# Patient Record
Sex: Male | Born: 1960 | Race: White | Hispanic: No | Marital: Married | Smoking: Never smoker
Health system: Southern US, Community
[De-identification: ages and names within clinical notes are randomized; demographics above are authoritative.]

## PROBLEM LIST (undated history)

## (undated) DIAGNOSIS — I10 Essential (primary) hypertension: Secondary | ICD-10-CM

## (undated) DIAGNOSIS — F419 Anxiety disorder, unspecified: Secondary | ICD-10-CM

## (undated) DIAGNOSIS — I639 Cerebral infarction, unspecified: Secondary | ICD-10-CM

## (undated) DIAGNOSIS — F32A Depression, unspecified: Secondary | ICD-10-CM

## (undated) DIAGNOSIS — F329 Major depressive disorder, single episode, unspecified: Secondary | ICD-10-CM

## (undated) HISTORY — PX: FINGER FRACTURE SURGERY: SHX638

## (undated) HISTORY — DX: Anxiety disorder, unspecified: F41.9

---

## 2015-05-16 ENCOUNTER — Inpatient Hospital Stay (HOSPITAL_COMMUNITY)
Admission: EM | Admit: 2015-05-16 | Discharge: 2015-05-20 | DRG: 064 | Disposition: A | Discharge status: Home / Self Care | Payer: BLUE CROSS/BLUE SHIELD | Attending: Neurology | Admitting: Neurology

## 2015-05-16 ENCOUNTER — Emergency Department (HOSPITAL_COMMUNITY): Payer: BLUE CROSS/BLUE SHIELD

## 2015-05-16 ENCOUNTER — Encounter (HOSPITAL_COMMUNITY): Payer: Self-pay | Admitting: Cardiology

## 2015-05-16 DIAGNOSIS — I619 Nontraumatic intracerebral hemorrhage, unspecified: Secondary | ICD-10-CM

## 2015-05-16 DIAGNOSIS — E871 Hypo-osmolality and hyponatremia: Secondary | ICD-10-CM | POA: Diagnosis not present

## 2015-05-16 DIAGNOSIS — I611 Nontraumatic intracerebral hemorrhage in hemisphere, cortical: Principal | ICD-10-CM | POA: Diagnosis present

## 2015-05-16 DIAGNOSIS — I161 Hypertensive emergency: Secondary | ICD-10-CM | POA: Diagnosis present

## 2015-05-16 DIAGNOSIS — G936 Cerebral edema: Secondary | ICD-10-CM | POA: Diagnosis present

## 2015-05-16 DIAGNOSIS — F329 Major depressive disorder, single episode, unspecified: Secondary | ICD-10-CM | POA: Diagnosis not present

## 2015-05-16 DIAGNOSIS — I69120 Aphasia following nontraumatic intracerebral hemorrhage: Secondary | ICD-10-CM

## 2015-05-16 DIAGNOSIS — E785 Hyperlipidemia, unspecified: Secondary | ICD-10-CM | POA: Diagnosis not present

## 2015-05-16 DIAGNOSIS — R4701 Aphasia: Secondary | ICD-10-CM | POA: Diagnosis not present

## 2015-05-16 DIAGNOSIS — I10 Essential (primary) hypertension: Secondary | ICD-10-CM

## 2015-05-16 DIAGNOSIS — I629 Nontraumatic intracranial hemorrhage, unspecified: Secondary | ICD-10-CM | POA: Diagnosis present

## 2015-05-16 DIAGNOSIS — F32A Depression, unspecified: Secondary | ICD-10-CM | POA: Diagnosis present

## 2015-05-16 DIAGNOSIS — I6789 Other cerebrovascular disease: Secondary | ICD-10-CM | POA: Diagnosis not present

## 2015-05-16 HISTORY — DX: Essential (primary) hypertension: I10

## 2015-05-16 HISTORY — DX: Depression, unspecified: F32.A

## 2015-05-16 HISTORY — DX: Major depressive disorder, single episode, unspecified: F32.9

## 2015-05-16 LAB — COMPREHENSIVE METABOLIC PANEL
ALT: 26 U/L (ref 17–63)
AST: 19 U/L (ref 15–41)
Albumin: 4.4 g/dL (ref 3.5–5.0)
Alkaline Phosphatase: 78 U/L (ref 38–126)
Anion gap: 11 (ref 5–15)
BUN: 23 mg/dL — ABNORMAL HIGH (ref 6–20)
CO2: 22 mmol/L (ref 22–32)
Calcium: 9 mg/dL (ref 8.9–10.3)
Chloride: 99 mmol/L — ABNORMAL LOW (ref 101–111)
Creatinine, Ser: 1.1 mg/dL (ref 0.61–1.24)
GFR calc Af Amer: >60 mL/min (ref >60)
GFR calc non Af Amer: >60 mL/min (ref >60)
Glucose, Bld: 135 mg/dL — ABNORMAL HIGH (ref 65–99)
Potassium: 3.8 mmol/L (ref 3.5–5.1)
Sodium: 132 mmol/L — ABNORMAL LOW (ref 135–145)
Total Bilirubin: 1.3 mg/dL — ABNORMAL HIGH (ref 0.3–1.2)
Total Protein: 8.4 g/dL — ABNORMAL HIGH (ref 6.5–8.1)

## 2015-05-16 LAB — URINALYSIS, ROUTINE W REFLEX MICROSCOPIC
Bilirubin Urine: NEGATIVE
Glucose, UA: NEGATIVE mg/dL
Ketones, ur: 40 mg/dL — AB
Leukocytes, UA: NEGATIVE
Nitrite: NEGATIVE
Protein, ur: NEGATIVE mg/dL
Specific Gravity, Urine: 1.025 (ref 1.005–1.030)
pH: 5.5 (ref 5.0–8.0)

## 2015-05-16 LAB — CBC
HCT: 46.7 % (ref 39.0–52.0)
Hemoglobin: 16.3 g/dL (ref 13.0–17.0)
MCH: 32.9 pg (ref 26.0–34.0)
MCHC: 34.9 g/dL (ref 30.0–36.0)
MCV: 94.2 fL (ref 78.0–100.0)
Platelets: 285 10*3/uL (ref 150–400)
RBC: 4.96 MIL/uL (ref 4.22–5.81)
RDW: 12.2 % (ref 11.5–15.5)
WBC: 7.5 10*3/uL (ref 4.0–10.5)

## 2015-05-16 LAB — RAPID URINE DRUG SCREEN, HOSP PERFORMED
Amphetamines: NOT DETECTED
Barbiturates: NOT DETECTED
Benzodiazepines: NOT DETECTED
Cocaine: NOT DETECTED
Opiates: NOT DETECTED
Tetrahydrocannabinol: NOT DETECTED

## 2015-05-16 LAB — DIFFERENTIAL
Basophils Absolute: 0 10*3/uL (ref 0.0–0.1)
Basophils Relative: 0 %
Eosinophils Absolute: 0 10*3/uL (ref 0.0–0.7)
Eosinophils Relative: 0 %
Lymphocytes Relative: 21 %
Lymphs Abs: 1.6 10*3/uL (ref 0.7–4.0)
Monocytes Absolute: 0.7 10*3/uL (ref 0.1–1.0)
Monocytes Relative: 9 %
Neutro Abs: 5.3 10*3/uL (ref 1.7–7.7)
Neutrophils Relative %: 70 %

## 2015-05-16 LAB — APTT: aPTT: 27 seconds (ref 24–37)

## 2015-05-16 LAB — URINE MICROSCOPIC-ADD ON

## 2015-05-16 LAB — I-STAT TROPONIN, ED: Troponin i, poc: 0 ng/mL (ref 0.00–0.08)

## 2015-05-16 LAB — PROTIME-INR
INR: 1.08 (ref 0.00–1.49)
Prothrombin Time: 14.2 seconds (ref 11.6–15.2)

## 2015-05-16 LAB — GLUCOSE, CAPILLARY: GLUCOSE-CAPILLARY: 135 mg/dL — AB (ref 65–99)

## 2015-05-16 LAB — ETHANOL: Alcohol, Ethyl (B): <5 mg/dL (ref <5)

## 2015-05-16 MED ORDER — SENNOSIDES-DOCUSATE SODIUM 8.6-50 MG PO TABS
1.0000 | ORAL_TABLET | Freq: Two times a day (BID) | ORAL | Status: DC
  Administered 2015-05-18 – 2015-05-20 (×4): 1 via ORAL
  Filled 2015-05-16 (×4): qty 1

## 2015-05-16 MED ORDER — LABETALOL HCL 5 MG/ML IV SOLN
20.0000 mg | INTRAVENOUS | Status: DC | PRN
  Administered 2015-05-16: 20 mg via INTRAVENOUS
  Filled 2015-05-16: qty 4

## 2015-05-16 MED ORDER — ACETAMINOPHEN 325 MG PO TABS
650.0000 mg | ORAL_TABLET | ORAL | Status: DC | PRN
  Administered 2015-05-19 – 2015-05-20 (×4): 650 mg via ORAL
  Filled 2015-05-16 (×4): qty 2

## 2015-05-16 MED ORDER — ACETAMINOPHEN 650 MG RE SUPP: 650.0000 mg | RECTAL | Status: DC | PRN

## 2015-05-16 MED ORDER — PANTOPRAZOLE SODIUM 40 MG IV SOLR
40.0000 mg | Freq: Every day | INTRAVENOUS | Status: DC
  Administered 2015-05-17 – 2015-05-18 (×3): 40 mg via INTRAVENOUS
  Filled 2015-05-16 (×3): qty 40

## 2015-05-16 MED ORDER — NICARDIPINE HCL IN NACL 20-0.86 MG/200ML-% IV SOLN
3.0000 mg/h | INTRAVENOUS | Status: DC
Start: 2015-05-16 — End: 2015-05-18
  Administered 2015-05-16: 2.5 mg/h via INTRAVENOUS
  Administered 2015-05-16: 5 mg/h via INTRAVENOUS
  Filled 2015-05-16 (×2): qty 200

## 2015-05-16 MED ORDER — STROKE: EARLY STAGES OF RECOVERY BOOK
Freq: Once | Status: AC
  Administered 2015-05-17: 1
  Filled 2015-05-16: qty 1

## 2015-05-16 NOTE — ED Notes (Signed)
Headache and confusion times 3 days.

## 2015-05-16 NOTE — ED Notes (Signed)
Report given to [facility]. 

## 2015-05-16 NOTE — ED Provider Notes (Signed)
CSN: [id number]     Arrival date & time 05/16/15  1708 History   First MD Initiated Contact with Patient 05/16/15 1744     Chief Complaint  Patient presents with  . Altered Mental Status     (Consider location/radiation/quality/duration/timing/severity/associated sxs/prior Treatment) HPI   55 year old male with headache and confusion. History is from family at bedside. Son and wife. Patient began complaining of headache and family noticed some confusion 3 days ago. No trauma that they are aware of. Persistent confusion since then so they brought him in for evaluation. Wife reports that it also been complaining of some shoulder and neck pain. 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 Hx of depression and HTN. No blood thinners. They have no specific concern for ingestion/overdose.   Past Medical History  Diagnosis Date  . Hypertension   . Depression    History reviewed. No pertinent past surgical history. History reviewed. No pertinent family history. Social History  Substance Use Topics  . Smoking status: Never Smoker   . Smokeless tobacco: None  . Alcohol Use: Yes     Comment: occasional    Review of Systems  Level 5 caveat because of encephalopathy.   Allergies  Review of patient's allergies indicates no known allergies.  Home Medications   Prior to Admission medications   Not on File   BP 164/100 mmHg  Pulse 102  Temp(Src) 100.3 F (37.9 C) (Rectal)  Resp 24  Ht  (1.854 m)  Wt 215 lb (97.523 kg)  BMI 28.37 kg/m2  SpO2 96% Physical Exam  Constitutional: He appears well-developed and well-nourished. No distress.  HENT:  Head: Normocephalic and atraumatic.  Eyes: Conjunctivae are normal. Right eye exhibits no discharge. Left eye exhibits no discharge.  Neck: Neck supple.  Cardiovascular: Normal rate, regular rhythm and normal heart sounds.  Exam reveals no  gallop and no friction rub.   No murmur heard. Pulmonary/Chest: Effort normal and breath sounds normal. No respiratory distress.  Abdominal: Soft. He exhibits no distension. There is no tenderness.  Musculoskeletal: He exhibits no edema or tenderness.  Neurological: He is alert.  Speech is clear but answers most questions with "yeah" and other answers nonsensical. Follows simple commands but has difficulty with more complex such as finger to nose or trying to assess visual fields. CN 2-12 seem intact. Strength 5/5 b/l u/l extremities. Did not attempt to ambulate. Could not assess sensation.   Skin: Skin is warm and dry.  Psychiatric: He has a normal mood and affect. His behavior is normal. Thought content normal.  Nursing note and vitals reviewed.   ED Course  Procedures (including critical care time)  CRITICAL CARE Performed by: Raeford Razor Total critical care time: 35 minutes Critical care time was exclusive of separately billable procedures and treating other patients. Critical care was necessary to treat or prevent imminent or life-threatening deterioration. Critical care was time spent personally by me on the following activities: development of treatment plan with patient and/or surrogate as well as nursing, discussions with consultants, evaluation of patient's response to treatment, examination of patient, obtaining history from patient or surrogate, ordering and performing treatments and interventions, ordering and review of laboratory studies, ordering and review of radiographic studies, pulse oximetry and re-evaluation of patient's condition.  Labs Review Labs Reviewed  MRSA PCR SCREENING - Abnormal; Notable for the following:    MRSA by  PCR POSITIVE (*)    All other components within normal limits  COMPREHENSIVE METABOLIC PANEL - Abnormal; Notable for the following:    Sodium 132 (*)    Chloride 99 (*)    Glucose, Bld 135 (*)    BUN 23 (*)    Total Protein 8.4 (*)     Total Bilirubin 1.3 (*)    All other components within normal limits  URINALYSIS, ROUTINE W REFLEX MICROSCOPIC (NOT AT [facility]) - Abnormal; Notable for the following:    Hgb urine dipstick MODERATE (*)    Ketones, ur 40 (*)    All other components within normal limits  URINE MICROSCOPIC-ADD ON - Abnormal; Notable for the following:    Squamous Epithelial / LPF 0-5 (*)    Bacteria, UA RARE (*)    All other components within normal limits  GLUCOSE, CAPILLARY - Abnormal; Notable for the following:    Glucose-Capillary 135 (*)    All other components within normal limits  LIPID PANEL - Abnormal; Notable for the following:    Cholesterol 208 (*)    HDL 35 (*)    LDL Cholesterol 154 (*)    All other components within normal limits  TSH - Abnormal; Notable for the following:    TSH 4.802 (*)    All other components within normal limits  BASIC METABOLIC PANEL - Abnormal; Notable for the following:    CO2 20 (*)    Glucose, Bld 101 (*)    Calcium 8.5 (*)    All other components within normal limits  BASIC METABOLIC PANEL - Abnormal; Notable for the following:    Glucose, Bld 119 (*)    Calcium 8.8 (*)    All other components within normal limits  BASIC METABOLIC PANEL - Abnormal; Notable for the following:    Potassium 3.3 (*)    Glucose, Bld 119 (*)    Calcium 8.6 (*)    All other components within normal limits  ETHANOL  PROTIME-INR  APTT  CBC  DIFFERENTIAL  URINE RAPID DRUG SCREEN, HOSP PERFORMED  CBC  HEMOGLOBIN A1C  VITAMIN B12  RPR  HIV ANTIBODY (ROUTINE TESTING)  CBC  CBC  I-STAT CHEM 8, ED  I-STAT TROPOININ, ED    Imaging Review No results found. I have personally reviewed and evaluated these images and lab results as part of my medical decision-making.   EKG Interpretation None      MDM   Final diagnoses:  Intracranial hemorrhage (HCC)  Essential hypertension    54yM who has been confused and complaining of headache for the past 3 days. On exam he is  confused. Answers most questions with "yeah." Other answers are nonsensical. Unclear if simply confused versus Broca's aphasia. Consider CVA. Also shoulder and neck pain. Rectal temp 100.3. Consider meningitis/encepahilitis. Will obtain CT head first. Likely LP.   6:34 PM Unfortunately, CT with head bleed. Suspect hypertensive. No known malignancy. No blood thinners. Will start with PRN labetolol. Possible infusion for further BP control if needed. Likely transfer to [facility].     Raeford Razor, MD 05/21/15 [phone]

## 2015-05-16 NOTE — H&P (Signed)
Neurology H&P  CC: Confusion  History is obtained from: Family, patient  HPI: Dakota [name] is a 55 y.o. male with a history of hypertension who was in his normal state of health until 3 days ago at which point he began having confusion and headache. He is not doing better today and therefore sought care in the emergency room where he was found to have ICH by head CT. He was hypertensive and did not respond to labetalol and therefore the carpet was started and he has been transferred from [facility] to [facility] for further management.   LKW: 2/13 tpa given?: no, ICH ICH Score: 0   ROS:  Unable to obtain due to aphasia  Past Medical History  Diagnosis Date  . Hypertension   . Depression      Family history: Unable to obtain due to aphasia   Social History:  reports that he has never smoked. He does not have any smokeless tobacco history on file. He reports that he drinks alcohol. He reports that he does not use illicit drugs.   Exam: Current vital signs: BP 132/79 mmHg  Pulse 80  Temp(Src) 99.2 F (37.3 C) (Oral)  Resp 11  Ht  (1.854 m)  Wt 97.9 kg (215 lb 13.3 oz)  BMI 28.48 kg/m2  SpO2 96% Vital signs in last 24 hours: Temp:  [99.2 F (37.3 C)-100.3 F (37.9 C)] 99.2 F (37.3 C) (02/16 2309) Pulse Rate:  [66-102] 80 (02/16 2330) Resp:  [8-24] 11 (02/16 2330) BP: (111-176)/(63-110) 132/79 mmHg (02/16 2330) SpO2:  [94 %-99 %] 96 % (02/16 2330) Weight:  [97.523 kg (215 lb)-97.9 kg (215 lb 13.3 oz)] 97.9 kg (215 lb 13.3 oz) (02/16 2300)   Physical Exam  Constitutional: Appears well-developed and well-nourished.  Psych: Affect appropriate to situation Eyes: No scleral injection HENT: No OP obstrucion Head: Normocephalic.  Cardiovascular: Normal rate and regular rhythm.  Respiratory: Effort normal and breath sounds normal to anterior ascultation GI: Soft.  No distension. There is no tenderness.  Skin: WDI  Neuro: Mental Status: Patient is awake,  alert, he has a predominantly fluent aphasia. He is able to answer some questions, and with repetition is able to answer reasonably fluently. He has difficulty understanding commands without being shown what to do. Cranial Nerves: II: Visual Fields are full. Pupils are equal, round, and reactive to light.   III,IV, VI: EOMI without ptosis or diploplia.  V: Facial sensation is symmetric to temperature VII: Facial movement is symmetric.  VIII: hearing is intact to voice X: Uvula elevates symmetrically XI: Shoulder shrug is symmetric. XII: tongue is midline without atrophy or fasciculations.  Motor: Tone is normal. Bulk is normal. 5/5 strength was present in all four extremities.  Sensory: Sensation is symmetric to light touch and temperature in the arms and legs. Cerebellar: FNF intact bilaterally    I have reviewed labs in epic and the results pertinent to this consultation are: CMP-mild hyponatremia  I have reviewed the images obtained: CT head-left parietotemporal ICH  Impression: 55 year old male with what I suspect is a hypertensive hemorrhage which likely happened 3 days ago. His been admitted to the intensive care unit for aggressive blood pressure management with nicardipine.  Recommendations: 1) Admit to ICU 2) no antiplatelets or anticoagulants 3) blood pressure control with goal systolic <140 4) Frequent neuro checks 5) If symptoms worsen or there is decreased mental status, repeat stat head CT 6) PT,OT,ST 7) Micardis pain for blood pressure control  This patient is critically ill and at significant risk of neurological worsening, death and care requires constant monitoring of vital signs, hemodynamics,respiratory and cardiac monitoring, neurological assessment, discussion with family, other specialists and medical decision making of high complexity. I spent 35 minutes of neurocritical care time  in the care of  this patient.  Ritta Slot, MD [facility] [phone]  If 7pm- 7am, please page neurology on call as listed in AMION. 05/16/2015  11:39 PM

## 2015-05-17 ENCOUNTER — Encounter (HOSPITAL_COMMUNITY): Payer: Self-pay | Admitting: Radiology

## 2015-05-17 ENCOUNTER — Inpatient Hospital Stay (HOSPITAL_COMMUNITY): Payer: BLUE CROSS/BLUE SHIELD

## 2015-05-17 DIAGNOSIS — I6789 Other cerebrovascular disease: Secondary | ICD-10-CM

## 2015-05-17 DIAGNOSIS — R4701 Aphasia: Secondary | ICD-10-CM

## 2015-05-17 DIAGNOSIS — I611 Nontraumatic intracerebral hemorrhage in hemisphere, cortical: Principal | ICD-10-CM

## 2015-05-17 LAB — CBC
HEMATOCRIT: 43.5 % (ref 39.0–52.0)
Hemoglobin: 15.4 g/dL (ref 13.0–17.0)
MCH: 33.2 pg (ref 26.0–34.0)
MCHC: 35.4 g/dL (ref 30.0–36.0)
MCV: 93.8 fL (ref 78.0–100.0)
PLATELETS: ADEQUATE 10*3/uL (ref 150–400)
RBC: 4.64 MIL/uL (ref 4.22–5.81)
RDW: 12.6 % (ref 11.5–15.5)
WBC: 6.9 10*3/uL (ref 4.0–10.5)

## 2015-05-17 LAB — VITAMIN B12: VITAMIN B 12: 312 pg/mL (ref 180–914)

## 2015-05-17 LAB — TSH: TSH: 4.802 u[IU]/mL — AB (ref 0.350–4.500)

## 2015-05-17 LAB — HIV ANTIBODY (ROUTINE TESTING W REFLEX): HIV Screen 4th Generation wRfx: NONREACTIVE

## 2015-05-17 LAB — LIPID PANEL
CHOL/HDL RATIO: 5.9 ratio
Cholesterol: 208 mg/dL — ABNORMAL HIGH (ref 0–200)
HDL: 35 mg/dL — AB (ref >40)
LDL CALC: 154 mg/dL — AB (ref 0–99)
Triglycerides: 95 mg/dL (ref <150)
VLDL: 19 mg/dL (ref 0–40)

## 2015-05-17 LAB — MRSA PCR SCREENING: MRSA by PCR: POSITIVE — AB

## 2015-05-17 LAB — RPR: RPR Ser Ql: NONREACTIVE

## 2015-05-17 MED ORDER — MUPIROCIN 2 % EX OINT
1.0000 "application " | TOPICAL_OINTMENT | Freq: Two times a day (BID) | CUTANEOUS | Status: DC
  Administered 2015-05-17 – 2015-05-20 (×7): 1 via NASAL
  Filled 2015-05-17 (×2): qty 22

## 2015-05-17 MED ORDER — SODIUM CHLORIDE 0.9 % IV SOLN
INTRAVENOUS | Status: DC
  Administered 2015-05-17 (×2): via INTRAVENOUS

## 2015-05-17 MED ORDER — IOHEXOL 350 MG/ML SOLN
50.0000 mL | Freq: Once | INTRAVENOUS | Status: AC | PRN
  Administered 2015-05-17: 50 mL via INTRAVENOUS

## 2015-05-17 MED ORDER — CHLORHEXIDINE GLUCONATE CLOTH 2 % EX PADS
6.0000 | MEDICATED_PAD | Freq: Every day | CUTANEOUS | Status: DC
  Administered 2015-05-17 – 2015-05-20 (×4): 6 via TOPICAL

## 2015-05-17 NOTE — Progress Notes (Addendum)
STROKE TEAM PROGRESS NOTE   HISTORY OF PRESENT ILLNESS Dakota [name] is a 55 y.o. male with a history of hypertension who was in his normal state of health until 3 days ago at which point he began having confusion and headache (LKW 05/13/15) . He is not doing better today 2/16 and therefore sought care in the emergency room where he was found to have ICH by head CT. He was hypertensive and did not respond to labetalol and therefore the cardene was started and he has been transferred from [facility] to [facility] for further management. ICH Score: 0. He was admitted to the neuro ICU for further evaluation and treatment.   SUBJECTIVE (INTERVAL HISTORY) His fiancee is at the bedside.  Overall he feels his condition is stable. Off cardene and BP stable. Pt still has partial global aphasia. Steffanie Rainwater stated that he has BP issue and not able to control at home with medications.    OBJECTIVE Temp:  [98.3 F (36.8 C)-100.3 F (37.9 C)] 98.3 F (36.8 C) (02/17 1600) Pulse Rate:  [57-102] 59 (02/17 1500) Cardiac Rhythm:  [-] Normal sinus rhythm (02/17 1400) Resp:  [8-24] 17 (02/17 1500) BP: (107-176)/(62-110) 130/80 mmHg (02/17 1500) SpO2:  [88 %-99 %] 95 % (02/17 1500) Weight:  [215 lb (97.523 kg)-215 lb 13.3 oz (97.9 kg)] 215 lb 13.3 oz (97.9 kg) (02/16 2300)  CBC:   Recent Labs Lab 05/16/15 1832 05/17/15 0658  WBC 7.5 6.9  NEUTROABS 5.3  --   HGB 16.3 15.4  HCT 46.7 43.5  MCV 94.2 93.8  PLT 285 PLATELET CLUMPS NOTED ON SMEAR, COUNT APPEARS ADEQUATE    Basic Metabolic Panel:   Recent Labs Lab 05/16/15 1832  NA 132*  K 3.8  CL 99*  CO2 22  GLUCOSE 135*  BUN 23*  CREATININE 1.10  CALCIUM 9.0    Lipid Panel:     Component Value Date/Time   CHOL 208* 05/17/2015 0658   TRIG 95 05/17/2015 0658   HDL 35* 05/17/2015 0658   CHOLHDL 5.9 05/17/2015 0658   VLDL 19 05/17/2015 0658   LDLCALC 154* 05/17/2015 0658   HgbA1c: No results found for: HGBA1C Urine Drug Screen:      Component Value Date/Time   LABOPIA NONE DETECTED 05/16/2015 2004   COCAINSCRNUR NONE DETECTED 05/16/2015 2004   LABBENZ NONE DETECTED 05/16/2015 2004   AMPHETMU NONE DETECTED 05/16/2015 2004   THCU NONE DETECTED 05/16/2015 2004   LABBARB NONE DETECTED 05/16/2015 2004      IMAGING I have personally reviewed the radiological images below and agree with the radiology interpretations. Blue text is my interpretation.  Ct Angio Head W/cm &/or Wo Cm  05/17/2015  IMPRESSION: Extrinsic mass effect on the LEFT anterior circulation from a large intraparenchymal hematoma, likely hypertensive in etiology. No associated vascular malformation or visible saccular or mycotic aneurysm. No evidence for MCA dissection. No abnormal postcontrast enhancement to suggest neoplasm. Increasing size compared with 05/16/2015 noncontrast CT. Based on cross-sectional measurements of 26 x 40 x 27 mm, approximate volume 14 mL. 5 mm LEFT-to-RIGHT shift with mild medialization of the LEFT uncus. Continued surveillance is warranted. There is no significant change of hematoma size over time, there was and is left temporoparietal hematoma about 30cc.   Ct Head Wo Contrast  05/16/2015  IMPRESSION: Large intraparenchymal hemorrhage seen in left parietal and temporal cortex resulting in 5 mm of left to right midline shift.   2D echo - pending   PHYSICAL EXAM  Temp:  [  98.3 F (36.8 C)-100.3 F (37.9 C)] 98.3 F (36.8 C) (02/17 1600) Pulse Rate:  [57-102] 59 (02/17 1500) Resp:  [8-24] 17 (02/17 1500) BP: (107-176)/(62-110) 130/80 mmHg (02/17 1500) SpO2:  [88 %-99 %] 95 % (02/17 1500) Weight:  [215 lb (97.523 kg)-215 lb 13.3 oz (97.9 kg)] 215 lb 13.3 oz (97.9 kg) (02/16 2300)  General - Well nourished, well developed, in no apparent distress.  Ophthalmologic - Fundi not visualized due to not following commands.  Cardiovascular - Regular rate and rhythm.  Mental Status -  Level of arousal and orientation to self  were intact, not able to answer other orientation questions due to aphasia. Partial global aphasia, able to pantomime, but not able to name or repeat.   Cranial Nerves II - XII - II - blinking to visual threat bilaterally. III, IV, VI - Extraocular movements intact. V - Facial sensation intact bilaterally. VII - Facial movement intact bilaterally. VIII - Hearing & vestibular intact bilaterally. X - Palate elevates symmetrically. XI - Chin turning & shoulder shrug intact bilaterally. XII - Tongue protrusion intact.  Motor Strength - The patient's strength was normal in all extremities and pronator drift was absent.  Bulk was normal and fasciculations were absent.   Motor Tone - Muscle tone was assessed at the neck and appendages and was normal.  Reflexes - The patient's reflexes were 1+ in all extremities and he had no pathological reflexes.  Sensory - Light touch, temperature/pinprick were assessed and were symmetrical.    Coordination - not cooperative on exam.  Tremor was absent.  Gait and Station - not tested.   ASSESSMENT/PLAN [name] is a 55 y.o. male with history of hypertension and depression presenting with confusion and HA. CT shows a large L parietal temporal hemorrhage.   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  CT angio head and neck - no AVM, aneurysm or malignancy seen  2D Echo  pending  LDL 154  HgbA1c pending  SCDs for VTE prophylaxis Diet NPO time specified - pending swallow evaluation today  No antithrombotic prior to admission, none with admission due to ICH  Ongoing aggressive stroke risk factor management  Therapy recommendations:  pending  Disposition:  Pending  Transfer to floor in am  Hypertensive  Emergency   BP 176/110 on arrival in setting of neurologic symptoms  Placed on cardene drip, now off  SBP goal < 140  Stable, even no BP meds needed at this time.  Hyperlipidemia  LDL 154, goal < 70  No home meds  Will initiate statin once passed swallow.  Continue on discharge  Other Stroke Risk Factors  ETOH use  Other Active Problems  Depression  Hospital day # 1  This patient is critically ill due to left temporoparietal ICH and at significant risk of neurological worsening, death form hematoma expansion, uncal herniation, cerebral edema and seizure. This patient's care requires constant monitoring of vital signs, hemodynamics, respiratory and cardiac monitoring, review of multiple databases, neurological assessment, discussion with family, other specialists and medical decision making of high complexity. I spent 40 minutes of neurocritical care time in the care of this patient.  Marvel Plan, MD PhD Stroke Neurology 05/17/2015 4:46 PM   To contact Stroke Continuity provider, please refer to [email or web address]. After  hours, contact General Neurology

## 2015-05-17 NOTE — Progress Notes (Signed)
Utilization review completed. Keaun Schnabel, RN, BSN. 

## 2015-05-17 NOTE — Progress Notes (Signed)
PT Cancellation Note  Patient Details Name: Dakota [name] MRN: [id number] DOB: December 04, 1960   Cancelled Treatment:    Reason Eval/Treat Not Completed: Patient not medically ready.  Pt currently on bedrest.  Please advance activity order once appropriate for PT and mobility.     Sunny Schlein, [place] [phone] 05/17/2015, 8:21 AM

## 2015-05-17 NOTE — Progress Notes (Signed)
  Echocardiogram 2D Echocardiogram has been performed.  Arvil Chaco 05/17/2015, 12:46 PM

## 2015-05-17 NOTE — Plan of Care (Signed)
Problem: Education: Goal: Knowledge of patient specific risk factors addressed and post discharge goals established will improve Outcome: Progressing Discussed risk factor of HTN on ICH with patient and family.

## 2015-05-17 NOTE — Care Management Note (Signed)
Case Management Note  Patient Details  Name: Dakota [name] MRN: [id number] Date of Birth: [birth year]  Subjective/Objective:     Pt admitted on 05/16/15 with Lt parietal hemorrhage.  PTA, pt independent, has supportive son and fiance.                Action/Plan: Will follow for discharge planning as pt progresses.    Expected Discharge Date:                  Expected Discharge Plan:  IP Rehab Facility  In-House Referral:     Discharge planning Services  CM Consult  Post Acute Care Choice:    Choice offered to:     DME Arranged:    DME Agency:     HH Arranged:    HH Agency:     Status of Service:  In process, will continue to follow  Medicare Important Message Given:    Date Medicare IM Given:    Medicare IM give by:    Date Additional Medicare IM Given:    Additional Medicare Important Message give by:     If discussed at Long Length of Stay Meetings, dates discussed:    Additional Comments:  Quintella Baton, RN, BSN  Trauma/Neuro ICU Case Manager [phone]

## 2015-05-18 LAB — CBC
HEMATOCRIT: 41.3 % (ref 39.0–52.0)
HEMOGLOBIN: 14.1 g/dL (ref 13.0–17.0)
MCH: 32.6 pg (ref 26.0–34.0)
MCHC: 34.1 g/dL (ref 30.0–36.0)
MCV: 95.4 fL (ref 78.0–100.0)
Platelets: 234 10*3/uL (ref 150–400)
RBC: 4.33 MIL/uL (ref 4.22–5.81)
RDW: 12.4 % (ref 11.5–15.5)
WBC: 6 10*3/uL (ref 4.0–10.5)

## 2015-05-18 LAB — BASIC METABOLIC PANEL
ANION GAP: 9 (ref 5–15)
BUN: 20 mg/dL (ref 6–20)
CHLORIDE: 109 mmol/L (ref 101–111)
CO2: 20 mmol/L — AB (ref 22–32)
Calcium: 8.5 mg/dL — ABNORMAL LOW (ref 8.9–10.3)
Creatinine, Ser: 0.99 mg/dL (ref 0.61–1.24)
GFR calc Af Amer: >60 mL/min (ref >60)
GFR calc non Af Amer: >60 mL/min (ref >60)
GLUCOSE: 101 mg/dL — AB (ref 65–99)
POTASSIUM: 4 mmol/L (ref 3.5–5.1)
Sodium: 138 mmol/L (ref 135–145)

## 2015-05-18 LAB — HEMOGLOBIN A1C
Hgb A1c MFr Bld: 5.6 % (ref 4.8–5.6)
Mean Plasma Glucose: 114 mg/dL

## 2015-05-18 NOTE — Progress Notes (Signed)
STROKE TEAM PROGRESS NOTE   HISTORY OF PRESENT ILLNESS Dakota [name] is a 55 y.o. male with a history of hypertension who was in his normal state of health until 3 days ago at which point he began having confusion and headache (LKW 05/13/15) . He is not doing better today 2/16 and therefore sought care in the emergency room where he was found to have ICH by head CT. He was hypertensive and did not respond to labetalol and therefore the cardene was started and he has been transferred from [facility] to [facility] for further management. ICH Score: 0. He was admitted to the neuro ICU for further evaluation and treatment.   SUBJECTIVE (INTERVAL HISTORY) Son's girlfriend is at bedside  Overall he feels his condition is stable. Off cardene and BP stable. Pt still has partial global aphasia. Steffanie Rainwater stated yesterday that he has BP issue and not able to control at home with medications.    OBJECTIVE Temp:  [97.2 F (36.2 C)-98.9 F (37.2 C)] 97.9 F (36.6 C) (02/18 0728) Pulse Rate:  [58-83] 63 (02/18 0700) Cardiac Rhythm:  [-] Normal sinus rhythm (02/18 0400) Resp:  [9-22] 13 (02/18 0700) BP: (114-162)/(64-96) 134/73 mmHg (02/18 0700) SpO2:  [91 %-99 %] 96 % (02/18 0700)  CBC:   Recent Labs Lab 05/16/15 1832 05/17/15 0658 05/18/15 0411  WBC 7.5 6.9 6.0  NEUTROABS 5.3  --   --   HGB 16.3 15.4 14.1  HCT 46.7 43.5 41.3  MCV 94.2 93.8 95.4  PLT 285 PLATELET CLUMPS NOTED ON SMEAR, COUNT APPEARS ADEQUATE 234    Basic Metabolic Panel:   Recent Labs Lab 05/16/15 1832 05/18/15 0411  NA 132* 138  K 3.8 4.0  CL 99* 109  CO2 22 20*  GLUCOSE 135* 101*  BUN 23* 20  CREATININE 1.10 0.99  CALCIUM 9.0 8.5*    Lipid Panel:     Component Value Date/Time   CHOL 208* 05/17/2015 0658   TRIG 95 05/17/2015 0658   HDL 35* 05/17/2015 0658   CHOLHDL 5.9 05/17/2015 0658   VLDL 19 05/17/2015 0658   LDLCALC 154* 05/17/2015 0658   HgbA1c:  Lab Results  Component Value Date   HGBA1C 5.6  05/17/2015   Urine Drug Screen:     Component Value Date/Time   LABOPIA NONE DETECTED 05/16/2015 2004   COCAINSCRNUR NONE DETECTED 05/16/2015 2004   LABBENZ NONE DETECTED 05/16/2015 2004   AMPHETMU NONE DETECTED 05/16/2015 2004   THCU NONE DETECTED 05/16/2015 2004   LABBARB NONE DETECTED 05/16/2015 2004      IMAGING I have personally reviewed the radiological images below and agree with the radiology interpretations. Blue text is my interpretation.  Ct Angio Head W/cm &/or Wo Cm  05/17/2015  IMPRESSION: Extrinsic mass effect on the LEFT anterior circulation from a large intraparenchymal hematoma, likely hypertensive in etiology. No associated vascular malformation or visible saccular or mycotic aneurysm. No evidence for MCA dissection. No abnormal postcontrast enhancement to suggest neoplasm. Increasing size compared with 05/16/2015 noncontrast CT. Based on cross-sectional measurements of 26 x 40 x 27 mm, approximate volume 14 mL. 5 mm LEFT-to-RIGHT shift with mild medialization of the LEFT uncus. Continued surveillance is warranted. There is no significant change of hematoma size over time, there was and is left temporoparietal hematoma about 30cc.   Ct Head Wo Contrast  05/16/2015  IMPRESSION: Large intraparenchymal hemorrhage seen in left parietal and temporal cortex resulting in 5 mm of left to right midline shift.  2D echo - pending   PHYSICAL EXAM  Temp:  [97.2 F (36.2 C)-98.9 F (37.2 C)] 97.9 F (36.6 C) (02/18 0728) Pulse Rate:  [58-83] 63 (02/18 0700) Resp:  [9-22] 13 (02/18 0700) BP: (114-162)/(64-96) 134/73 mmHg (02/18 0700) SpO2:  [91 %-99 %] 96 % (02/18 0700)  neurologic exam stable as compared to yesterday  General - Well nourished, well developed, in no apparent distress.  Ophthalmologic - Fundi not visualized due to not following commands.  Cardiovascular - Regular rate and rhythm.  Mental Status -  Level of arousal and orientation to self were  intact, not able to answer other orientation questions due to aphasia. Partial global aphasia, able to pantomime, but not able to name or repeat.   Cranial Nerves II - XII - II - blinking to visual threat bilaterally. III, IV, VI - Extraocular movements intact. V - Facial sensation intact bilaterally. VII - Facial movement intact bilaterally. VIII - Hearing & vestibular intact bilaterally. X - Palate elevates symmetrically. XI - Chin turning & shoulder shrug intact bilaterally. XII - Tongue protrusion intact.  Motor Strength - The patient's strength was normal in all extremities and pronator drift was absent.  Bulk was normal and fasciculations were absent.   Motor Tone - Muscle tone was assessed at the neck and appendages and was normal.  Reflexes - The patient's reflexes were 1+ in all extremities and he had no pathological reflexes.  Sensory - Light touch, temperature/pinprick were assessed and were symmetrical.    Coordination - not cooperative on exam.  Tremor was absent.  Gait and Station - not tested.   ASSESSMENT/PLAN Mr. Delray Reza is a 55 y.o. male with history of hypertension and depression presenting with confusion and HA. CT shows a large L parietal temporal hemorrhage.   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  CT angio head and neck - no AVM, aneurysm or malignancy seen  2D Echo - EF 55-60%. No cardiac source of emboli identified.  LDL 154  HgbA1c 5.6  SCDs for VTE prophylaxis Diet NPO time specified - pending swallow evaluation.  No antithrombotic prior to admission, none with admission due to ICH  Ongoing aggressive stroke risk factor management  Therapy recommendations:  pending  Disposition:  Pending  Transfer to  floor today  Hypertensive Emergency   BP 176/110 on arrival in setting of neurologic symptoms  Placed on cardene drip, now off  SBP goal < 140  Stable, even no BP meds needed at this time.  Hyperlipidemia  LDL 154, goal < 70  No home meds  Will initiate statin once passed swallow.  Continue on discharge  Other Stroke Risk Factors  ETOH use  Other Active Problems  Depression  Hospital day # 2   Personally examined patient and images, and have participated in and made any corrections needed to history, physical, neuro exam,assessment and plan as stated above.  I have personally obtained the history, evaluated lab date, reviewed imaging studies and agree with radiology interpretations.    Naomie Dean, MD Stroke Neurology    To contact Stroke Continuity provider, please refer to [email or web address]. After hours, contact General Neurology

## 2015-05-18 NOTE — Evaluation (Signed)
Physical Therapy Evaluation Patient Details Name: Brookes Craine MRN: [id number] DOB: 1960/12/09 Today's Date: 05/18/2015   History of Present Illness  Admitted with 3 day hx of confusion, workup revealed ICH.  Clinical Impression  Patient did well with mobility - independent for all gait and transfers.  No loss of balance even with challenges.  No further PT needs identified.  Patient with significant expressive difficulties and I believe was trying to indicate some vision issues.  Will alert OT to assess for vision.  SLP following for expressive difficulties.  PT will sign off.    Follow Up Recommendations No PT follow up    Equipment Recommendations  None recommended by PT    Recommendations for Other Services       Precautions / Restrictions Precautions Precautions: Fall      Mobility  Bed Mobility Overal bed mobility: Modified Independent             General bed mobility comments: used railing  Transfers Overall transfer level: Modified independent Equipment used: None                Ambulation/Gait Ambulation/Gait assistance: Modified independent (Device/Increase time) Ambulation Distance (Feet): 300 Feet Assistive device: None Gait Pattern/deviations: WFL(Within Functional Limits)   Gait velocity interpretation: at or above normal speed for age/gender General Gait Details: able to tolerate varying speeds, quick stops and perturbations with no loss of balance  Stairs            Wheelchair Mobility    Modified Rankin (Stroke Patients Only) Modified Rankin (Stroke Patients Only) Pre-Morbid Rankin Score: No symptoms Modified Rankin: Moderate disability     Balance Overall balance assessment: No apparent balance deficits (not formally assessed)                                           Pertinent Vitals/Pain Pain Assessment: No/denies pain    Home Living Family/patient expects to be discharged to:: Private  residence Living Arrangements: Spouse/significant other;Children               Additional Comments: unable to obtain from patient due to expressive difficulties    Prior Function Level of Independence: Independent               Hand Dominance        Extremity/Trunk Assessment               Lower Extremity Assessment: Overall WFL for tasks assessed         Communication   Communication: Expressive difficulties  Cognition Arousal/Alertness: Awake/alert Behavior During Therapy: WFL for tasks assessed/performed Overall Cognitive Status: Difficult to assess (appears within functional limits; able to follow commands.  )                      General Comments      Exercises        Assessment/Plan    PT Assessment Patent does not need any further PT services (Feel patient will have significant SLP and OT needs)  PT Diagnosis Abnormality of gait   PT Problem List    PT Treatment Interventions     PT Goals (Current goals can be found in the Care Plan section) Acute Rehab PT Goals PT Goal Formulation: All assessment and education complete, DC therapy    Frequency     Barriers to discharge  Co-evaluation               End of Session Equipment Utilized During Treatment: Gait belt Activity Tolerance: Patient tolerated treatment well Patient left: in bed;with call bell/phone within reach           Time: 1535-1558 PT Time Calculation (min) (ACUTE ONLY): 23 min   Charges:   PT Evaluation $PT Eval Moderate Complexity: 1 Procedure     PT G CodesOlivia Canter 05/18/2015, 4:10 PM  05/18/2015 Corlis Hove, PT [phone]

## 2015-05-18 NOTE — Evaluation (Signed)
Clinical/Bedside Swallow Evaluation Patient Details  Name: Dakota [name] MRN: [id number] Date of Birth: [birth year]  Today's Date: 05/18/2015 Time: SLP Start Time (ACUTE ONLY): 1219 SLP Stop Time (ACUTE ONLY): 1240 SLP Time Calculation (min) (ACUTE ONLY): 21 min  Past Medical History:  Past Medical History  Diagnosis Date  . Hypertension   . Depression    Past Surgical History: History reviewed. No pertinent past surgical history. HPI:  [name] is a 55 y.o. male with history of hypertension and depression presenting with confusion and HA. CT shows a large L parietal temporal hemorrhage.    Assessment / Plan / Recommendation Clinical Impression  Patient presents with a functional oropharyngeal swallow without evidence of dysphagia. Given acute CVA and discrepency in stroke swallow screen findings (initially passed however repeated and failed), will f/u briefly for ensure diet tolerance.     Aspiration Risk  Mild aspiration risk    Diet Recommendation Regular;Thin liquid   Liquid Administration via: Cup;Straw Medication Administration: Whole meds with liquid Supervision: Patient able to self feed Compensations: Slow rate;Small sips/bites Postural Changes: Seated upright at 90 degrees    Other  Recommendations Oral Care Recommendations: Oral care BID   Follow up Recommendations  Inpatient Rehab (for aphasia)    Frequency and Duration min 1 x/week  1 week           Swallow Study   General HPI: [name] is a 55 y.o. male with history of hypertension and depression presenting with confusion and HA. CT shows a large L parietal temporal hemorrhage.  Type of Study: Bedside Swallow Evaluation Previous Swallow Assessment: none Diet Prior to this Study: NPO Temperature Spikes Noted: No Respiratory Status: Room air History of Recent Intubation: No Behavior/Cognition: Alert;Cooperative;Pleasant mood (aphasia) Oral Cavity Assessment: Within Functional  Limits Oral Care Completed by SLP: Recent completion by staff Oral Cavity - Dentition: Adequate natural dentition Vision: Functional for self-feeding Self-Feeding Abilities: Able to feed self Patient Positioning: Upright in bed Baseline Vocal Quality: Normal Volitional Cough: Strong Volitional Swallow: Able to elicit    Oral/Motor/Sensory Function Overall Oral Motor/Sensory Function: Within functional limits   Ice Chips Ice chips: Not tested   Thin Liquid Thin Liquid: Within functional limits Presentation: Cup;Self Fed;Straw    Nectar Thick Nectar Thick Liquid: Not tested   Honey Thick Honey Thick Liquid: Not tested   Puree Puree: Within functional limits Presentation: Self Fed;Spoon   Solid   GO  Dakota Vanputten MA, CCC-SLP [phone]  Solid: Within functional limits Presentation: Self Fed        Dakota [name] Meryl 05/18/2015,1:20 PM

## 2015-05-19 LAB — CBC
HEMATOCRIT: 40.8 % (ref 39.0–52.0)
HEMOGLOBIN: 14 g/dL (ref 13.0–17.0)
MCH: 32.3 pg (ref 26.0–34.0)
MCHC: 34.3 g/dL (ref 30.0–36.0)
MCV: 94 fL (ref 78.0–100.0)
Platelets: 244 10*3/uL (ref 150–400)
RBC: 4.34 MIL/uL (ref 4.22–5.81)
RDW: 12.4 % (ref 11.5–15.5)
WBC: 6.2 10*3/uL (ref 4.0–10.5)

## 2015-05-19 LAB — BASIC METABOLIC PANEL
ANION GAP: 6 (ref 5–15)
BUN: 16 mg/dL (ref 6–20)
CALCIUM: 8.8 mg/dL — AB (ref 8.9–10.3)
CHLORIDE: 107 mmol/L (ref 101–111)
CO2: 25 mmol/L (ref 22–32)
CREATININE: 1.02 mg/dL (ref 0.61–1.24)
GFR calc non Af Amer: >60 mL/min (ref >60)
GLUCOSE: 119 mg/dL — AB (ref 65–99)
Potassium: 3.5 mmol/L (ref 3.5–5.1)
Sodium: 138 mmol/L (ref 135–145)

## 2015-05-19 MED ORDER — PANTOPRAZOLE SODIUM 40 MG PO TBEC
40.0000 mg | DELAYED_RELEASE_TABLET | Freq: Every day | ORAL | Status: DC
  Administered 2015-05-19: 40 mg via ORAL
  Filled 2015-05-19: qty 1

## 2015-05-19 MED ORDER — AMLODIPINE BESYLATE 5 MG PO TABS
5.0000 mg | ORAL_TABLET | Freq: Every day | ORAL | Status: DC
  Administered 2015-05-19 – 2015-05-20 (×2): 5 mg via ORAL
  Filled 2015-05-19 (×2): qty 1

## 2015-05-19 MED ORDER — ATORVASTATIN CALCIUM 40 MG PO TABS
40.0000 mg | ORAL_TABLET | Freq: Every day | ORAL | Status: DC
  Administered 2015-05-19: 40 mg via ORAL
  Filled 2015-05-19: qty 1

## 2015-05-19 NOTE — Progress Notes (Signed)
Occupational Therapy Evaluation Patient Details Name: Dakota [name] MRN: [id number] DOB: [birth year] Today's Date: 05/19/2015    History of Present Illness Admitted with 3 day hx of confusion, workup revealed temporal-parietal ICH.   Clinical Impression   PTA, pt was independent with ADLs and mobility. Pt currently presents with expressive and receptive aphasia, decreased attention, ideomotor apraxia, and possibly some visual deficits (unable to elicit more information from pt due to aphasia). Pt completed ADL tasks with mod verbal cues for initiation, proper sequencing and safety. Pt plans to d/c home with 24/7 assistance from his girlfriend per son's report. Pt will benefit from continued acute OT to increase independence and safety with ADLs, cognition, and mobility. Recommend OP OT to address cognitive deficits.    Follow Up Recommendations  Outpatient OT;Supervision/Assistance - 24 hour    Equipment Recommendations  None recommended by OT    Recommendations for Other Services       Precautions / Restrictions Precautions Precautions: Fall Restrictions Weight Bearing Restrictions: No      Mobility Bed Mobility Overal bed mobility: Modified Independent             General bed mobility comments: HOB flat, no use of bedrails  Transfers Overall transfer level: Needs assistance Equipment used: None Transfers: Sit to/from Stand Sit to Stand: Supervision         General transfer comment: Supervision for safety - pt having difficulty understanding commands and executing them.    Balance Overall balance assessment: Needs assistance Sitting-balance support: No upper extremity supported;Feet supported Sitting balance-Leahy Scale: Normal     Standing balance support: No upper extremity supported;During functional activity Standing balance-Leahy Scale: Good Standing balance comment: Some staggering steps. Further assessment necessary                             ADL Overall ADL's : Needs assistance/impaired     Grooming: Wash/dry hands;Supervision/safety;Cueing for sequencing;Standing Grooming Details (indicate cue type and reason): Cues to locate and use papertowels and garbage can         Upper Body Dressing : Set up;Sitting;Cueing for sequencing   Lower Body Dressing: Set up;Cueing for sequencing;Sit to/from stand   Toilet Transfer: Supervision/safety;Cueing for sequencing;Ambulation;Regular Teacher, adult education Details (indicate cue type and reason): Cues to use toilet. Pt also was unaware that he had voided and continually stated "I don't. I didn't go to it. I don't need it (toilet paper)."  Toileting- Clothing Manipulation and Hygiene: Minimal assistance;Cueing for sequencing;Sit to/from stand Toileting - Clothing Manipulation Details (indicate cue type and reason): Pt required assist to move gown before sitting (sequencing)     Functional mobility during ADLs: Supervision/safety General ADL Comments: Pt requires mod verbal cues and visual demonstration for sequencing through tasks. Pt inconsistent and could sometimes sequence through tasks with ease and other times would miss steps or walk away from task before completing it. At times, pt's speech very clear and understood commands/questions and responded accurately. Other times, pt would answer questions inappropriately, repeat therapist's words, or say "Do you want me to get up and do ____ (whatever therapist asked)" several times.      Vision Vision Assessment?: Yes Eye Alignment: Within Functional Limits Ocular Range of Motion: Within Functional Limits Alignment/Gaze Preference: Within Defined Limits Tracking/Visual Pursuits: Able to track stimulus in all quads without difficulty Saccades: Within functional limits Convergence: Within functional limits Visual Fields: No apparent deficits Additional Comments: Unable to elicit more information about  pt's perceived visual  changes. Pt required cues to locate papertowels, but difficult to determine if this was attention/sequencing deficit or vision.   Perception     Praxis Praxis Praxis tested?: Deficits Deficits: Initiation;Ideomotor;Organization Praxis-Other Comments: Difficulty imitating therapist commands and required verbal/tactile/visual cues to initiate tasks and sequence through them completely.     Pertinent Vitals/Pain Pain Assessment: No/denies pain     Hand Dominance Right   Extremity/Trunk Assessment Upper Extremity Assessment Upper Extremity Assessment: Overall WFL for tasks assessed   Lower Extremity Assessment Lower Extremity Assessment: Overall WFL for tasks assessed   Cervical / Trunk Assessment Cervical / Trunk Assessment: Normal   Communication Communication Communication: Expressive difficulties;Receptive difficulties   Cognition Arousal/Alertness: Awake/alert Behavior During Therapy: WFL for tasks assessed/performed Overall Cognitive Status: Difficult to assess Area of Impairment: Following commands;Safety/judgement;Awareness;Problem solving;Attention   Current Attention Level: Selective   Following Commands: Follows one step commands inconsistently;Follows one step commands with increased time Safety/Judgement: Decreased awareness of safety;Decreased awareness of deficits Awareness: Emergent Problem Solving: Decreased initiation;Slow processing;Difficulty sequencing;Requires verbal cues;Requires tactile cues General Comments: Son says sometimes his speech is very clear especially when he's very happy, angry or anxious. Also, he is having difficulty remembering who people are unless he can see them. Pt demonstrated some difficulty imitating therapist, following commands, understanding questions and responding appropriately consistenly, and sequencing through tasks. As session progressed, pt's speech and ability to follow commands improved.    General Comments        Exercises       Shoulder Instructions      Home Living Family/patient expects to be discharged to:: Private residence Living Arrangements: Spouse/significant other Available Help at Discharge: Family;Available 24 hours/day                                    Prior Functioning/Environment Level of Independence: Independent        Comments: Works on hot rod cars    OT Diagnosis: Cognitive deficits;Disturbance of vision;Other (comment);Apraxia (Aphasia)   OT Problem List: Impaired vision/perception;Decreased coordination;Decreased cognition;Decreased safety awareness   OT Treatment/Interventions: Cognitive remediation/compensation;Visual/perceptual remediation/compensation;Patient/family education;Balance training;Therapeutic activities    OT Goals(Current goals can be found in the care plan section) Acute Rehab OT Goals Patient Stated Goal: to get better OT Goal Formulation: With patient Time For Goal Achievement: 06/02/15 Potential to Achieve Goals: Good ADL Goals Additional ADL Goal #1: Pt will sequence through 1 ADL task with no verbal cues for initiation and/or sequencing Additional ADL Goal #2: Pt will accurately identify and utilize 3/3 objects in room during ADL task with no verbal cues.  OT Frequency: Min 2X/week   Barriers to D/C:            Co-evaluation              End of Session Equipment Utilized During Treatment: Gait belt Nurse Communication: Mobility status  Activity Tolerance: Patient tolerated treatment well Patient left: in bed;with call bell/phone within reach;with bed alarm set;with family/visitor present   Time: 1402-1430 OT Time Calculation (min): 28 min Charges:  OT General Charges $OT Visit: 1 Procedure OT Evaluation $OT Eval Moderate Complexity: 1 Procedure OT Treatments $Self Care/Home Management : 8-22 mins G-Codes:    Nils Pyle, OTR/L Pager: [phone] 05/19/2015, 4:14 PM

## 2015-05-19 NOTE — Progress Notes (Signed)
STROKE TEAM PROGRESS NOTE   HISTORY OF PRESENT ILLNESS Dakota [name] is a 55 y.o. male with a history of hypertension who was in his normal state of health until 3 days ago at which point he began having confusion and headache (LKW 05/13/15) . He is not doing better today 2/16 and therefore sought care in the emergency room where he was found to have ICH by head CT. He was hypertensive and did not respond to labetalol and therefore the cardene was started and he has been transferred from [facility] to [facility] for further management. ICH Score: 0. He was admitted to the neuro ICU for further evaluation and treatment.   SUBJECTIVE (INTERVAL HISTORY) Brother at bedside. Feels slightly improved in speech.    OBJECTIVE Temp:  [98 F (36.7 C)-99.4 F (37.4 C)] 98.4 F (36.9 C) (02/19 0548) Pulse Rate:  [61-114] 78 (02/19 0548) Cardiac Rhythm:  [-] Normal sinus rhythm (02/18 2130) Resp:  [10-24] 20 (02/19 0548) BP: (126-164)/(67-94) 126/73 mmHg (02/19 0548) SpO2:  [93 %-98 %] 93 % (02/19 0548)  CBC:   Recent Labs Lab 05/16/15 1832  05/18/15 0411 05/19/15 0455  WBC 7.5  < > 6.0 6.2  NEUTROABS 5.3  --   --   --   HGB 16.3  < > 14.1 14.0  HCT 46.7  < > 41.3 40.8  MCV 94.2  < > 95.4 94.0  PLT 285  < > 234 244  < > = values in this interval not displayed.  Basic Metabolic Panel:   Recent Labs Lab 05/18/15 0411 05/19/15 0455  NA 138 138  K 4.0 3.5  CL 109 107  CO2 20* 25  GLUCOSE 101* 119*  BUN 20 16  CREATININE 0.99 1.02  CALCIUM 8.5* 8.8*    Lipid Panel:     Component Value Date/Time   CHOL 208* 05/17/2015 0658   TRIG 95 05/17/2015 0658   HDL 35* 05/17/2015 0658   CHOLHDL 5.9 05/17/2015 0658   VLDL 19 05/17/2015 0658   LDLCALC 154* 05/17/2015 0658   HgbA1c:  Lab Results  Component Value Date   HGBA1C 5.6 05/17/2015   Urine Drug Screen:     Component Value Date/Time   LABOPIA NONE DETECTED 05/16/2015 2004   COCAINSCRNUR NONE DETECTED 05/16/2015 2004    LABBENZ NONE DETECTED 05/16/2015 2004   AMPHETMU NONE DETECTED 05/16/2015 2004   THCU NONE DETECTED 05/16/2015 2004   LABBARB NONE DETECTED 05/16/2015 2004      IMAGING I have personally reviewed the radiological images below and agree with the radiology interpretations. Blue text is my interpretation.  Ct Angio Head W/cm &/or Wo Cm 05/17/2015  IMPRESSION:  Extrinsic mass effect on the LEFT anterior circulation from a large intraparenchymal hematoma, likely hypertensive in etiology. No associated vascular malformation or visible saccular or mycotic aneurysm. No evidence for MCA dissection. No abnormal postcontrast enhancement to suggest neoplasm. Increasing size compared with 05/16/2015 noncontrast CT. Based on cross-sectional measurements of 26 x 40 x 27 mm, approximate volume 14 mL. 5 mm LEFT-to-RIGHT shift with mild medialization of the LEFT uncus. Continued surveillance is warranted. There is no significant change of hematoma size over time, there was and is left temporoparietal hematoma about 30cc.   Ct Head Wo Contrast 05/16/2015  IMPRESSION:  Large intraparenchymal hemorrhage seen in left parietal and temporal cortex resulting in 5 mm of left to right midline shift.    2D echo - pending   PHYSICAL EXAM  Temp:  [id number]  F (36.7 C)-99.4 F (37.4 C)] 98.4 F (36.9 C) (02/19 0548) Pulse Rate:  [61-114] 78 (02/19 0548) Resp:  [10-24] 20 (02/19 0548) BP: (126-164)/(67-94) 126/73 mmHg (02/19 0548) SpO2:  [93 %-98 %] 93 % (02/19 0548)  neurologic exam stable as compared to yesterday  General - Well nourished, well developed, in no apparent distress.  Ophthalmologic - Fundi not visualized due to not following commands.  Cardiovascular - Regular rate and rhythm.  Mental Status -  Level of arousal and orientation to self were intact, not able to answer other orientation questions due to aphasia. Ca say his own name, can't answer onth, year, date. Can say some phrases such as "I am  hot". Partial global aphasia, able to pantomime, but not able to name or repeat.   Cranial Nerves II - XII - II - blinking to visual threat bilaterally. III, IV, VI - Extraocular movements intact. V - Facial sensation intact bilaterally. VII - Facial movement intact bilaterally. VIII - Hearing & vestibular intact bilaterally. X - Palate elevates symmetrically. XI - Chin turning & shoulder shrug intact bilaterally. XII - Tongue protrusion intact.  Motor Strength - The patient's strength was normal in all extremities and pronator drift was absent.  Bulk was normal and fasciculations were absent.   Motor Tone - Muscle tone was assessed at the neck and appendages and was normal.  Reflexes - The patient's reflexes were 1+ in all extremities and he had no pathological reflexes.  Sensory - Light touch, temperature/pinprick were assessed and were symmetrical.    Coordination - not cooperative on exam.  Tremor was absent.  Gait and Station - not tested.   ASSESSMENT/PLAN [name] is a 55 y.o. male with history of hypertension and depression presenting with confusion and HA. CT shows a large L parietal temporal hemorrhage. No TPA due to hemorrhage.  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  CT angio head and neck - no AVM, aneurysm or malignancy seen  2D Echo - EF 55-60%. No cardiac source of emboli identified.  LDL 154  HgbA1c 5.6  SCDs for VTE prophylaxis Diet regular Room service appropriate?: Yes; Fluid consistency:: Thin   No antithrombotic prior to admission, none with admission due to ICH  Ongoing aggressive stroke risk factor management  Therapy recommendations: No follow-up PT  Disposition:  Pending   Hypertensive Emergency    BP 176/110 on arrival in setting of neurologic symptoms  Placed on cardene drip, now off  SBP goal < 140 Occasional elevated systolic blood pressure. Continue to monitor.  Will start on low dose amlodipine  and monitor BP.   Hyperlipidemia  LDL 154, goal < 70  No home meds  Now on Lipitor 40 mg daily  Continue on discharge  Other Stroke Risk Factors  ETOH use  Other Active Problems  Depression  Hospital day # 3   Personally examined patient and images, and have participated in and made any corrections needed to history, physical, neuro exam,assessment and plan as stated above.  I have personally obtained the history, evaluated lab date, reviewed imaging studies and agree with radiology interpretations.    Naomie Dean, MD Stroke Neurology    To contact Stroke Continuity provider, please refer to [email or web address]. After hours, contact General Neurology

## 2015-05-20 DIAGNOSIS — I161 Hypertensive emergency: Secondary | ICD-10-CM | POA: Diagnosis present

## 2015-05-20 DIAGNOSIS — F329 Major depressive disorder, single episode, unspecified: Secondary | ICD-10-CM | POA: Diagnosis present

## 2015-05-20 DIAGNOSIS — E785 Hyperlipidemia, unspecified: Secondary | ICD-10-CM | POA: Diagnosis present

## 2015-05-20 DIAGNOSIS — F32A Depression, unspecified: Secondary | ICD-10-CM | POA: Diagnosis present

## 2015-05-20 LAB — BASIC METABOLIC PANEL
ANION GAP: 7 (ref 5–15)
BUN: 8 mg/dL (ref 6–20)
CHLORIDE: 107 mmol/L (ref 101–111)
CO2: 23 mmol/L (ref 22–32)
Calcium: 8.6 mg/dL — ABNORMAL LOW (ref 8.9–10.3)
Creatinine, Ser: 0.86 mg/dL (ref 0.61–1.24)
GFR calc non Af Amer: >60 mL/min (ref >60)
Glucose, Bld: 119 mg/dL — ABNORMAL HIGH (ref 65–99)
Potassium: 3.3 mmol/L — ABNORMAL LOW (ref 3.5–5.1)
Sodium: 137 mmol/L (ref 135–145)

## 2015-05-20 MED ORDER — AMLODIPINE BESYLATE 5 MG PO TABS: 5.0000 mg | ORAL_TABLET | Freq: Every day | ORAL | Status: DC

## 2015-05-20 MED ORDER — ATORVASTATIN CALCIUM 40 MG PO TABS: 40.0000 mg | ORAL_TABLET | Freq: Every day | ORAL | Status: DC

## 2015-05-20 MED ORDER — CITALOPRAM HYDROBROMIDE 10 MG PO TABS
10.0000 mg | ORAL_TABLET | Freq: Every day | ORAL | Status: DC
  Administered 2015-05-20: 10 mg via ORAL
  Filled 2015-05-20: qty 1

## 2015-05-20 MED ORDER — METOPROLOL SUCCINATE ER 25 MG PO TB24
50.0000 mg | ORAL_TABLET | Freq: Every day | ORAL | Status: DC
  Administered 2015-05-20: 50 mg via ORAL
  Filled 2015-05-20: qty 2

## 2015-05-20 NOTE — Discharge Summary (Signed)
Stroke Discharge Summary  Patient ID: Dakota [name]   MRN: [id number]      DOB: 1960-06-13  Date of Admission: 05/16/2015 Date of Discharge: 05/20/2015  Attending Physician:  No att. providers found, Stroke MD Consulting Physician(s):   Treatment Team:  Md Stroke, MD None  Patient's PCP:  No PCP Per Patient  DISCHARGE DIAGNOSIS:  Principal Problem:   ICH (intracerebral hemorrhage) (HCC) -  Large L parietal temporal hemorrhage, hypertensive Active Problems:   Hypertensive emergency   Hyperlipidemia   Depression  BMI: Body mass index is 28.48 kg/(m^2).  Past Medical History  Diagnosis Date  . Hypertension   . Depression    History reviewed. No pertinent past surgical history.    Medication List    STOP taking these medications        ibuprofen 200 MG tablet  Commonly known as:  ADVIL,MOTRIN      TAKE these medications        amLODipine 5 MG tablet  Commonly known as:  NORVASC  Take 1 tablet (5 mg total) by mouth daily.     atorvastatin 40 MG tablet  Commonly known as:  LIPITOR  Take 1 tablet (40 mg total) by mouth daily at 6 PM.     citalopram 20 MG tablet  Commonly known as:  CELEXA  Take 10 mg by mouth daily.     metoprolol succinate 50 MG 24 hr tablet  Commonly known as:  TOPROL-XL  Take 50 mg by mouth daily. Take with or immediately following a meal.        LABORATORY STUDIES CBC    Component Value Date/Time   WBC 6.2 05/19/2015 0455   RBC 4.34 05/19/2015 0455   HGB 14.0 05/19/2015 0455   HCT 40.8 05/19/2015 0455   PLT 244 05/19/2015 0455   MCV 94.0 05/19/2015 0455   MCH 32.3 05/19/2015 0455   MCHC 34.3 05/19/2015 0455   RDW 12.4 05/19/2015 0455   LYMPHSABS 1.6 05/16/2015 1832   MONOABS 0.7 05/16/2015 1832   EOSABS 0.0 05/16/2015 1832   BASOSABS 0.0 05/16/2015 1832   CMP    Component Value Date/Time   NA 137 05/20/2015 0545   K 3.3* 05/20/2015 0545   CL 107 05/20/2015 0545   CO2 23 05/20/2015 0545   GLUCOSE 119* 05/20/2015 0545    BUN 8 05/20/2015 0545   CREATININE 0.86 05/20/2015 0545   CALCIUM 8.6* 05/20/2015 0545   PROT 8.4* 05/16/2015 1832   ALBUMIN 4.4 05/16/2015 1832   AST 19 05/16/2015 1832   ALT 26 05/16/2015 1832   ALKPHOS 78 05/16/2015 1832   BILITOT 1.3* 05/16/2015 1832   GFRNONAA >60 05/20/2015 0545   GFRAA >60 05/20/2015 0545   COAGS Lab Results  Component Value Date   INR 1.08 05/16/2015   Lipid Panel    Component Value Date/Time   CHOL 208* 05/17/2015 0658   TRIG 95 05/17/2015 0658   HDL 35* 05/17/2015 0658   CHOLHDL 5.9 05/17/2015 0658   VLDL 19 05/17/2015 0658   LDLCALC 154* 05/17/2015 0658   HgbA1C  Lab Results  Component Value Date   HGBA1C 5.6 05/17/2015   Cardiac Panel (last 3 results) No results for input(s): CKTOTAL, CKMB, TROPONINI, RELINDX in the last 72 hours. Urinalysis    Component Value Date/Time   COLORURINE YELLOW 05/16/2015 2004   APPEARANCEUR CLEAR 05/16/2015 2004   LABSPEC 1.025 05/16/2015 2004   PHURINE 5.5 05/16/2015 2004   GLUCOSEU NEGATIVE 05/16/2015 2004  HGBUR MODERATE* 05/16/2015 2004   BILIRUBINUR NEGATIVE 05/16/2015 2004   KETONESUR 40* 05/16/2015 2004   PROTEINUR NEGATIVE 05/16/2015 2004   NITRITE NEGATIVE 05/16/2015 2004   LEUKOCYTESUR NEGATIVE 05/16/2015 2004   Urine Drug Screen     Component Value Date/Time   LABOPIA NONE DETECTED 05/16/2015 2004   COCAINSCRNUR NONE DETECTED 05/16/2015 2004   LABBENZ NONE DETECTED 05/16/2015 2004   AMPHETMU NONE DETECTED 05/16/2015 2004   THCU NONE DETECTED 05/16/2015 2004   LABBARB NONE DETECTED 05/16/2015 2004    Alcohol Level    Component Value Date/Time   ETH <5 05/16/2015 1832     SIGNIFICANT DIAGNOSTIC STUDIES  Ct Angio Head & Neck  05/17/2015  Extrinsic mass effect on the LEFT anterior circulation from a large intraparenchymal hematoma, likely hypertensive in etiology. No associated vascular malformation or visible saccular or mycotic aneurysm. No evidence for MCA dissection. No  abnormal postcontrast enhancement to suggest neoplasm. Increasing size compared with 05/16/2015 noncontrast CT. Based on cross-sectional measurements of 26 x 40 x 27 mm, approximate volume 14 mL. 5 mm LEFT-to-RIGHT shift with mild medialization of the LEFT uncus. Continued surveillance is warranted. There is no significant change of hematoma size over time, there was and is left temporoparietal hematoma about 30cc.   Ct Head Wo Contrast 05/16/2015  Large intraparenchymal hemorrhage seen in left parietal and temporal cortex resulting in 5 mm of left to right midline shift.   2D echo  - Left ventricle: The cavity size was normal. There was mildconcentric hypertrophy. Systolic function was normal. Theestimated ejection fraction was in the range of 55% to 60%.Images were inadequate for LV wall motion assessment. There wasan increased relative contribution of atrial contraction toventricular filling. Doppler parameters are consistent withabnormal left ventricular relaxation (grade 1 diastolicdysfunction). - Aortic valve: Poorly visualized. - Mitral valve: Poorly visualized. - Tricuspid valve: Poorly visualized. Impressions: - No cardiac source of emboli was indentified.     HISTORY OF PRESENT ILLNESS Dakota [name] is a 55 y.o. male with a history of hypertension who was in his normal state of health until 3 days ago at which point he began having confusion and headache (LKW 05/13/15) . He is not doing better today 2/16 and therefore sought care in the emergency room where he was found to have ICH by head CT. He was hypertensive and did not respond to labetalol and therefore the cardene was started and he has been transferred from [facility] to [facility] for further management. ICH Score: 0. He was admitted to the neuro ICU for further evaluation and treatment.    HOSPITAL COURSE [name] is a 55 y.o. male with history of hypertension and depression presenting with confusion and HA.  CT shows a large L parietal temporal hemorrhage to be secondary to hypertensive emergencywith blood pressure 176/110 on arrival. He was monitored in the ICU on Cardene drip. Once stabilized, he was transferred to the floor. He remains with significant expressive aphasia but no physical needs. He will be discharged home with his family (fiance).  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  CT angio head and neck - no AVM, aneurysm or malignancy seen  2D Echo - EF 55-60%. No cardiac source of emboli identified.  LDL 154  HgbA1c 5.6  No antithrombotic prior to  admission  Ongoing aggressive stroke risk factor management  Therapy recommendations: No follow-up PT, outpatient OT and speech  Disposition: return home with family  Hypertensive Emergency   BP 176/110 on arrival in setting of neurologic symptoms  Placed on cardene drip, now off  started on low dose amlodipine    Hyperlipidemia  LDL 154, goal < 70  No home meds  Now on Lipitor 40 mg daily  Continue on discharge  Other Stroke Risk Factors  ETOH use  Other Active Problems  Depression   DISCHARGE EXAM Blood pressure 151/83, pulse 68, temperature 98.5 F (36.9 C), temperature source Oral, resp. rate 18, height  (1.854 m), weight 97.9 kg (215 lb 13.3 oz), SpO2 98 %. General - Well nourished, well developed, in no apparent distress. Ophthalmologic - Fundi not visualized due to not following commands. Cardiovascular - Regular rate and rhythm. Mental Status -  Level of arousal and orientation to self were intact, not able to answer other orientation questions due to aphasia. Ca say his own name, can't answer onth, year, date. Can say some phrases such as "I am hot". Partial  global aphasia, able to pantomime, but not able to name or repeat.  Cranial Nerves II - XII - II - blinking to visual threat bilaterally. III, IV, VI - Extraocular movements intact. V - Facial sensation intact bilaterally. VII - Facial movement intact bilaterally. VIII - Hearing & vestibular intact bilaterally. X - Palate elevates symmetrically. XI - Chin turning & shoulder shrug intact bilaterally. XII - Tongue protrusion intact. Motor Strength - The patient's strength was normal in all extremities and pronator drift was absent. Bulk was normal and fasciculations were absent.  Motor Tone - Muscle tone was assessed at the neck and appendages and was normal. Reflexes - The patient's reflexes were 1+ in all extremities and he had no pathological reflexes. Sensory - Light touch, temperature/pinprick were assessed and were symmetrical.  Coordination - not cooperative on exam. Tremor was absent. Gait and Station - not tested.   Discharge Diet   Diet regular Room service appropriate?: Yes; Fluid consistency:: Thin liquids  DISCHARGE PLAN  Disposition:  Home with family  Outpatient occupational and speech therapies  Follow-up No PCP Per Patient in 2 weeks.  Follow-up with Dr. Delia Heady, Stroke Clinic in 2 months.  40 minutes were spent preparing discharge.  Rhoderick Moody [facility] See Amion for Pager information 05/20/2015 5:02 PM  I have personally examined this patient, reviewed notes, independently viewed imaging studies, participated in medical decision making and plan of care. I have made any additions or clarifications directly to the above note. Agree with note above.    Delia Heady, MD Medical Director [facility] Pager: [phone] 05/20/2015 7:21 PM

## 2015-05-20 NOTE — Progress Notes (Signed)
Patient is discharged form room 5M19 at this time. Alert and in stable condition. IV site d/c'd as well as tele. Instructions read to patient and family with understanding verbalized. Left unit via wheelchair with all belongings and family at side.

## 2015-05-20 NOTE — Progress Notes (Signed)
Occupational Therapy Treatment Patient Details Name: Dakota [name] MRN: [id number] DOB: 1960-05-14 Today's Date: 05/20/2015    History of present illness Admitted with 3 day hx of confusion, workup revealed temporal-parietal ICH.   OT comments  Patient making slow progress towards OT goals due to cognition and aphasia. Pt limited by global aphasia and requires multimodal cueing for his safety. Son reports that patient has ADD and brother reports that patient does not like many questions or commands at once. Administered MOCA, pt scored 2/30. Therapist encouraged pt to look at therapist when she was reading the question or giving commands, if pt looking away he tended to start talking more & perseverating on different things, not paying attention to what the therapist was saying.   Follow Up Recommendations  Outpatient OT;Supervision/Assistance - 24 hour    Equipment Recommendations  None recommended by OT    Recommendations for Other Services  None at this time   Precautions / Restrictions Precautions Precautions: Fall Precaution Comments: global aphasia Restrictions Weight Bearing Restrictions: No           ADL Overall ADL's : Needs assistance/impaired   General ADL Comments: Administerred MOCA. Pt scored 2/30. Discussed with family in room importance of being patient and giving pt time to verbalize or show you want he needs/want. Also encouraged family to work with him on exercises like a simple word search puzzle or having patient write down certain words such as his name, his sons name, etc.      Vision Additional Comments: Prior to giving MOCA, pt donned glassess without any cueing needed. Patient able to verbalize numbers (held up by therapists fingers 100% of the time in different quadrants) Difficult to fully asses secondary to patient's decreased cognition.           Cognition   Behavior During Therapy: WFL for tasks assessed/performed Overall Cognitive Status:  Difficult to assess   General Comments: Pt with difficulty expressing self and difficulty understanding therapists questions or commands. Administerred MOCA, score =2/30.                  Pertinent Vitals/ Pain       Pain Assessment: No/denies pain   Frequency Min 2X/week     Progress Toward Goals  OT Goals(current goals can now befound in the care plan section)  Progress towards OT goals: Progressing toward goals  Acute Rehab OT Goals Patient Stated Goal: none stated OT Goal Formulation: With patient/family Time For Goal Achievement: 06/02/15 Potential to Achieve Goals: Good  Plan Discharge plan remains appropriate    Activity Tolerance Patient tolerated treatment well  Patient Left in chair;with call bell/phone within reach;with family/visitor present    Time: 7829-5621 OT Time Calculation (min): 37 min  Charges: OT General Charges $OT Visit: 1 Procedure OT Treatments $Therapeutic Activity: 23-37 mins  Edwin Cap , MS, OTR/L, CLT Pager: [phone]  05/20/2015, 1:57 PM

## 2015-05-20 NOTE — Progress Notes (Signed)
Speech Language Pathology Treatment: Dysphagia  Patient Details Name: Dakota [name] MRN: [id number] DOB: [birth year] Today's Date: 05/20/2015 Time: 1130-1140 SLP Time Calculation (min) (ACUTE ONLY): 10 min  Assessment / Plan / Recommendation Clinical Impression  F/u diet tolerance assessment complete. Patient with independent consumption of prescribed diet with no overt indication of aspiration and independent use of safe swallowing precautions. Per son, no overt indication of aspiration noted since swallowing evaluated on 2/18. Lung sounds clear and patient afebrile. Overall, appears to be tolerating current diet. No SLP f/u for dysphagia indicated.    HPI HPI: [name] is a 55 y.o. male with history of hypertension and depression presenting with confusion and HA. CT shows a large L parietal temporal hemorrhage.       SLP Plan  All goals met     Recommendations  Diet recommendations: Regular;Thin liquid Liquids provided via: Cup;Straw Medication Administration: Whole meds with liquid Supervision: Patient able to self feed Compensations: Slow rate;Small sips/bites Postural Changes and/or Swallow Maneuvers: Seated upright 90 degrees             Oral Care Recommendations: Oral care BID Plan: All goals met     GO             Dakota Rainwater MA, CCC-SLP [phone]    Dakota [name] 05/20/2015, 1:45 PM

## 2015-05-20 NOTE — Evaluation (Signed)
Speech Language Pathology Evaluation Patient Details Name: Dakota [name] MRN: [id number] DOB: Nov 03, 1960 Today's Date: 05/20/2015 Time: 1324-4010 SLP Time Calculation (min) (ACUTE ONLY): 15 min  Problem List:  Patient Active Problem List   Diagnosis Date Noted  . Intracranial hemorrhage (HCC) 05/16/2015  . ICH (intracerebral hemorrhage) (HCC) 05/16/2015   Past Medical History:  Past Medical History  Diagnosis Date  . Hypertension   . Depression    Past Surgical History: History reviewed. No pertinent past surgical history. HPI:  Mr. Zoey Gilkeson is a 55 y.o. male with history of hypertension and depression presenting with confusion and HA. CT shows a large L parietal temporal hemorrhage.    Assessment / Plan / Recommendation Clinical Impression  Patient present with global aphasia impacting both comprehension and expression. Recommend OP f/u after d/c.     SLP Assessment  Patient needs continued Speech Lanaguage Pathology Services    Follow Up Recommendations  Outpatient SLP    Frequency and Duration min 2x/week  2 weeks      SLP Evaluation Prior Functioning  Cognitive/Linguistic Baseline: Within functional limits   Cognition  Overall Cognitive Status: Difficult to assess Arousal/Alertness: Awake/alert Orientation Level: Oriented to person;Disoriented to time;Disoriented to situation;Oriented to place    Comprehension  Auditory Comprehension Overall Auditory Comprehension: Impaired Yes/No Questions: Impaired Basic Biographical Questions: 0-25% accurate Commands: Impaired One Step Basic Commands: 50-74% accurate Two Step Basic Commands: 0-24% accurate Conversation: Simple (requires moderate-max cues) Visual Recognition/Discrimination Discrimination: Not tested Reading Comprehension Reading Status: Unable to assess (comment) (given extent of receptive and expressive deficits)    Expression Expression Primary Mode of Expression: Verbal Verbal  Expression Overall Verbal Expression: Impaired Initiation: No impairment Automatic Speech: Name;Social Response Level of Generative/Spontaneous Verbalization: Phrase Repetition: Impaired Level of Impairment: Word level Naming: Impairment Responsive: 0-25% accurate Confrontation: Impaired Convergent: 0-24% accurate Divergent: 0-24% accurate Verbal Errors: Phonemic paraphasias;Neologisms;Not aware of errors Pragmatics: No impairment Written Expression Dominant Hand: Right   Oral / Motor  Oral Motor/Sensory Function Overall Oral Motor/Sensory Function: Within functional limits Motor Speech Overall Motor Speech: Appears within functional limits for tasks assessed   GO            Ferdinand Lango MA, CCC-SLP [phone]         Joeann Steppe Meryl 05/20/2015, 2:54 PM

## 2015-05-20 NOTE — Care Management Note (Signed)
Case Management Note  Patient Details  Name: Dakota [name] MRN: [id number] Date of Birth: [birth year]  Subjective/Objective:                    Action/Plan: Patient discharging home with family and self care. Orders for Outpatient OT/ST. Patient lives in [place], [place]. [place] met with the patient and his son and they are interested in the [place]. CM spoke with [facility] outpatient in [place] and they state they can provide these services. Information they requested was faxed. Will updated the bedside RN.  Expected Discharge Date:                  Expected Discharge Plan:  Home/Self Care  In-House Referral:     Discharge planning Services  CM Consult  Post Acute Care Choice:    Choice offered to:     DME Arranged:    DME Agency:     HH Arranged:    [place] Agency:     Status of Service:  Completed, signed off  Medicare Important Message Given:    Date Medicare IM Given:    Medicare IM give by:    Date Additional Medicare IM Given:    Additional Medicare Important Message give by:     If discussed at [place] of Stay Meetings, dates discussed:    Additional Comments:  Pollie Friar, RN 05/20/2015, 3:27 PM

## 2015-05-24 ENCOUNTER — Ambulatory Visit (HOSPITAL_COMMUNITY): Payer: BLUE CROSS/BLUE SHIELD | Attending: Neurology | Admitting: Specialist

## 2015-05-24 DIAGNOSIS — I69920 Aphasia following unspecified cerebrovascular disease: Secondary | ICD-10-CM | POA: Diagnosis present

## 2015-05-24 DIAGNOSIS — I629 Nontraumatic intracranial hemorrhage, unspecified: Secondary | ICD-10-CM | POA: Insufficient documentation

## 2015-05-24 DIAGNOSIS — R4701 Aphasia: Secondary | ICD-10-CM | POA: Insufficient documentation

## 2015-05-24 DIAGNOSIS — IMO0002 Reserved for concepts with insufficient information to code with codable children: Secondary | ICD-10-CM

## 2015-05-24 NOTE — Therapy (Signed)
[facility] [address] [place], [place], [address] Phone: [phone]   Fax:  [phone]  Occupational Therapy Evaluation  Patient Details  Name: Dakota [name] MRN: [id number] Date of Birth: [birth year] Referring Provider: Dr. Delia Heady  Encounter Date: 05/24/2015      OT End of Session - 05/24/15 2005    Visit Number 1   Number of Visits 1   Authorization Type BCBS   OT Start Time 1435   OT Stop Time 1525   OT Time Calculation (min) 50 min   Activity Tolerance Patient tolerated treatment well   Behavior During Therapy [facility] for tasks assessed/performed      Past Medical History  Diagnosis Date  . Hypertension   . Depression     No past surgical history on file.  There were no vitals filed for this visit.  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  I just dont feel right.  My head hurts I have a cold.    Patient is accompained by: Family member  brother Tinnie Gens and fiances [name]   Pertinent History Mr. Kurtenbach presented to the ED on 05/16/15 s/p severe headache and confusion for 3 days.  A CT of his head detected a intracranial hemmorhage of L parietal temporal lobe.  He was discharged home on 05/21/15 and has been referred to occupational therapy for evaluation and treatment.     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 Yes   Pain Score 4    Pain Location Head   Pain Orientation Mid   Pain Descriptors / Indicators Headache   Pain Type Acute pain   Pain Radiating Towards n/a   Pain Onset In the past 7 days   Pain Frequency Intermittent   Aggravating Factors  unknown   Pain Relieving Factors unknown   Effect of Pain on Daily  Activities difficulty focusing           [facility] OT Assessment - 05/24/15 0001    Assessment   Diagnosis S/P Left Intracranial Hemmorhage   Referring Provider Dr. Delia Heady   Onset Date 05/16/15   Prior Therapy acute care OT and Pt   Precautions   Precautions None   Restrictions   Weight Bearing Restrictions No   Balance Screen   Has the patient fallen in the past 6 months No   Has the patient had a decrease in activity level because of a fear of falling?  No   Is the patient reluctant to leave their home because of a fear of falling?  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  Patient  able to pour a drink independently, and has gone to his shop.  he has not attempted driving or working in his shop   Mobility   Mobility Status Independent   Written Expression   Dominant Hand Right   Handwriting 100% legible   Vision - History   Baseline Vision Wears glasses only for reading   Patient Visual Report --  [facility]   Vision Assessment   Eye Alignment Within Functional Limits   Vision Assessment Vision not tested   Activity Tolerance   Sitting Balance --  WFL   Cognition   Overall Cognitive Status Impaired/Different from baseline   Mini Mental State Exam  completed MOCA this date with score of 9/30, earlier this week score was 2/30  difficult to fully assess due to receptive and expressive aphasia.   Attention --  perseverates on headache and not feeling well   Memory --  unable to assess   Awareness  Impaired   Problem Solving Impaired   Behaviors Restless;Verbal agitation;Poor frustration tolerance   Observation/Other Assessments   Focus on Therapeutic Outcomes (FOTO)  65/100   Sensation   Light Touch Appears Intact   Stereognosis Appears Intact   Additional Comments able to describe 3/3 objects with stereognosis testing, could not name object   Coordination   Gross Motor Movements are Fluid and Coordinated Yes   Fine Motor Movements are Fluid and Coordinated Yes   9 Hole Peg Test Right;Left   Right 9 Hole Peg Test 28.83"   Left 9 Hole Peg Test 20.30"   Perception   Perception Impaired   Praxis   Praxis Impaired   Praxis Impairment Details Perseveration   AROM   Overall AROM Comments BUE A/ROM is WNL   Strength   Overall Strength Comments BUE strength is WNL   Hand Function   Right Hand Grip (lbs) 105   Right Hand Lateral Pinch 16 lbs   Right Hand 3 Point Pinch 22 lbs   Left Hand Grip (lbs) 105   Left Hand Lateral Pinch 20 lbs   Left 3 point pinch 30 lbs                         OT Education - 05/24/15 2003    Education provided Yes  IADL such as making sandwich, light activity at his shop   Person(s) Educated Patient;Caregiver(s)   Methods Explanation   Comprehension Verbalized understanding          OT Short Term Goals - 05/24/15 2013    OT SHORT TERM GOAL #1   Title Patient will be educated on home exercise program for improved independence with I/ADLs.   Time 1   Period Weeks   Status Achieved                  Plan - 05/24/15 2006    Clinical Impression Statement A:  Patient is a 55 year old male presenting S/P ICH of left temporal lobe approximatel 1 week ago.  Patient with mod-max receptive and expressive aphasia.  A/ROM, strength, coordination, sensation all WNL this date.  Administered MOCA, and patient scored 9/30, an improvement form 2/30 earlier in the week as an inpatient.  Patient is able to dress, bathe, groom, eat,  and perform toileting actiivities iwth SBA- independence.  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  Paitient was able to write his name, address, telephone number.  He was  able to explain with some expressive difficulty how to make a 90 degree bend in sheet metal and drew a 4 manual gear for therapist this date.  When given a story problem with simple math, he was able to independently answer correctly.  As his expressive and recepitve aphasia are the primary barriers to independence with his B/IADls, work, and leisure activities, OT is not indicated as ST will be evaluating patient.  Discussed strategies to try at home for improved word recall such as word searches and crosswords.  Encouraged return to light meal prep and simple actiivites in his shop.     OT Frequency 1x / week   OT Duration --  1 Week   OT Treatment/Interventions Patient/family education   Plan P:  Skilled Occupational therapy services not indicated at this time, as patient will be recieving speech therapy for his primary deficits of expressive and receptive aphasia.  If increased funcitonal deficits unrealted to aphasia arise, occupational therapy may be reordered.     Recommended Other Services outpatient speech therapy   Consulted and Agree with Plan of Care Patient;Family member/caregiver   Family Member Consulted brother Tinnie Gens and fiance [name]        Problem List Patient Active Problem List   Diagnosis Date Noted  . Hypertensive emergency 05/20/2015  . Hyperlipidemia 05/20/2015  . Depression 05/20/2015  . ICH (intracerebral hemorrhage) (HCC) -  Large L parietal temporal hemorrhage, hypertensive 05/16/2015    Shirlean Mylar, OTR/L [phone]  05/24/2015, 8:17 PM  [facility] [address] [place], [place], [address] Phone: [phone]   Fax:  [phone]  Name: Dakota [name] MRN: [id number] Date of Birth:  [birth year]

## 2015-05-27 ENCOUNTER — Emergency Department (HOSPITAL_COMMUNITY)
Admission: EM | Admit: 2015-05-27 | Discharge: 2015-05-27 | Disposition: A | Discharge status: Home / Self Care | Payer: BLUE CROSS/BLUE SHIELD | Attending: Emergency Medicine | Admitting: Emergency Medicine

## 2015-05-27 ENCOUNTER — Encounter (HOSPITAL_COMMUNITY): Payer: Self-pay | Admitting: *Deleted

## 2015-05-27 ENCOUNTER — Emergency Department (HOSPITAL_COMMUNITY): Payer: BLUE CROSS/BLUE SHIELD

## 2015-05-27 ENCOUNTER — Ambulatory Visit (HOSPITAL_COMMUNITY): Payer: BLUE CROSS/BLUE SHIELD | Admitting: Speech Pathology

## 2015-05-27 DIAGNOSIS — F329 Major depressive disorder, single episode, unspecified: Secondary | ICD-10-CM | POA: Diagnosis not present

## 2015-05-27 DIAGNOSIS — Z79899 Other long term (current) drug therapy: Secondary | ICD-10-CM | POA: Diagnosis not present

## 2015-05-27 DIAGNOSIS — R040 Epistaxis: Secondary | ICD-10-CM | POA: Insufficient documentation

## 2015-05-27 DIAGNOSIS — Z8673 Personal history of transient ischemic attack (TIA), and cerebral infarction without residual deficits: Secondary | ICD-10-CM | POA: Diagnosis not present

## 2015-05-27 DIAGNOSIS — I629 Nontraumatic intracranial hemorrhage, unspecified: Secondary | ICD-10-CM | POA: Insufficient documentation

## 2015-05-27 DIAGNOSIS — I10 Essential (primary) hypertension: Secondary | ICD-10-CM | POA: Insufficient documentation

## 2015-05-27 HISTORY — DX: Cerebral infarction, unspecified: I63.9

## 2015-05-27 MED ORDER — HYDROCODONE-ACETAMINOPHEN 5-325 MG PO TABS: 1.0000 | ORAL_TABLET | ORAL | Status: DC | PRN

## 2015-05-27 MED ORDER — HYDROCODONE-ACETAMINOPHEN 5-325 MG PO TABS
2.0000 | ORAL_TABLET | Freq: Once | ORAL | Status: AC
  Administered 2015-05-27: 2 via ORAL
  Filled 2015-05-27: qty 2

## 2015-05-27 MED ORDER — CLONIDINE HCL 0.1 MG PO TABS
0.1000 mg | ORAL_TABLET | Freq: Once | ORAL | Status: AC
  Administered 2015-05-27: 0.1 mg via ORAL
  Filled 2015-05-27: qty 1

## 2015-05-27 MED ORDER — CLONIDINE HCL 0.1 MG PO TABS: ORAL_TABLET | ORAL | Status: AC

## 2015-05-27 MED ORDER — AMLODIPINE BESYLATE 5 MG PO TABS
5.0000 mg | ORAL_TABLET | Freq: Once | ORAL | Status: AC
  Administered 2015-05-27: 5 mg via ORAL
  Filled 2015-05-27: qty 1

## 2015-05-27 MED ORDER — SILVER NITRATE-POT NITRATE 75-25 % EX MISC
CUTANEOUS | Status: AC
  Filled 2015-05-27: qty 1

## 2015-05-27 NOTE — ED Notes (Signed)
Pt comes in from home for HTN. Pt had a stroke and was discharged from [facility] last week. Pts family his BP has slowly been trending higher and higher.  181/90 in triage. Pt has no new weakness of deficits since last week.

## 2015-05-27 NOTE — Discharge Instructions (Signed)
Take 10 mg per day of amlodipine and follow-up closely for blood pressure management with your primary doctor. Return to the ER for worsening neurologic symptoms. Hold pressure on nares for 20 min at a time for bleeding. For severe pain take norco or vicodin however realize they have the potential for addiction and it can make you sleepy and has tylenol in it.  No operating machinery while taking.  If blood pressure is persistently elevated despite taking home medicines you can take a dose of clonidine. If you were given medicines take as directed.  If you are on coumadin or contraceptives realize their levels and effectiveness is altered by many different medicines.  If you have any reaction (rash, tongues swelling, other) to the medicines stop taking and see a physician.    If your blood pressure was elevated in the ER make sure you follow up for management with a primary doctor or return for chest pain, shortness of breath or stroke symptoms.  Please follow up as directed and return to the ER or see a physician for new or worsening symptoms.  Thank you. Filed Vitals:   05/27/15 1150 05/27/15 1200 05/27/15 1230 05/27/15 1300  BP: 158/89 150/88 147/85 164/93  Pulse: 92 88 78 91  Temp:      TempSrc:      Resp: Height:      Weight:      SpO2: 100% 100% 97% 98%

## 2015-05-27 NOTE — ED Provider Notes (Addendum)
CSN: [id number]     Arrival date & time 05/27/15  1114 History  By signing my name below, I, Phillis Haggis, attest that this documentation has been prepared under the direction and in the presence of Blane Ohara, MD. Electronically Signed: Phillis Haggis, ED Scribe. 05/27/2015. 11:55 AM.   Chief Complaint  Patient presents with  . Hypertension   The history is provided by the patient. No language interpreter was used.  HPI Comments: Dakota [name] is a 55 y.o. Male with a hx of HTN and stroke who presents to the Emergency Department complaining of increased BP onset one day ago. Pt was discharged from [facility] last week for a stroke, which affected his speech. He is able to understand Family states that his BP has been increasing steadily ever since. The highest his BP has been was 198/97 and was lowest at 155/94 at home. Pt takes amlodipine at home. BP is 181/90 in triage. Pt has been having a gradually worsening headache and taking Tylenol to no relief. Pt was not placed on aspirin after his stroke because it was caused by a hemorrhage, not a clot. They deny new stroke symptoms including numbness or weakness, back pain, or abdominal pain.   Past Medical History  Diagnosis Date  . Hypertension   . Depression   . Stroke [facility])    History reviewed. No pertinent past surgical history. No family history on file. Social History  Substance Use Topics  . Smoking status: Never Smoker   . Smokeless tobacco: None  . Alcohol Use: Yes     Comment: occasional    Review of Systems  Gastrointestinal: Negative for abdominal pain.  Musculoskeletal: Negative for back pain.  Neurological: Positive for headaches. Negative for weakness and numbness.  All other systems reviewed and are negative.  Allergies  Review of patient's allergies indicates no known allergies.  Home Medications   Prior to Admission medications   Medication Sig Start Date End Date Taking? Authorizing Provider  acetaminophen  (TYLENOL) 325 MG tablet Take 650 mg by mouth every 6 (six) hours as needed.   Yes Historical Provider, MD  amLODipine (NORVASC) 5 MG tablet Take 1 tablet (5 mg total) by mouth daily. 05/20/15  Yes Layne Benton, NP  atorvastatin (LIPITOR) 40 MG tablet Take 1 tablet (40 mg total) by mouth daily at 6 PM. 05/20/15  Yes Layne Benton, NP  citalopram (CELEXA) 20 MG tablet Take 10 mg by mouth daily.   Yes Historical Provider, MD  cloNIDine (CATAPRES) 0.1 MG tablet 1 tab po once daily as needed for blood pressure > 140/90 05/27/15   Blane Ohara, MD   BP 164/93 mmHg  Pulse 91  Temp(Src) 98.9 F (37.2 C) (Oral)  Resp 21  Ht  (1.854 m)  Wt 210 lb (95.255 kg)  BMI 27.71 kg/m2  SpO2 98% Physical Exam  Constitutional: He is oriented to person, place, and time. He appears well-developed and well-nourished.  HENT:  Head: Normocephalic and atraumatic.  Eyes: EOM are normal. Pupils are equal, round, and reactive to light.  Neck: Normal range of motion. Neck supple.  Cardiovascular: Normal rate, regular rhythm and normal heart sounds.  Exam reveals no gallop and no friction rub.   No murmur heard. Pulmonary/Chest: Effort normal and breath sounds normal. He has no wheezes.  Abdominal: Soft. There is no tenderness.  Musculoskeletal: Normal range of motion.  No leg swelling  Neurological: He is alert and oriented to person, place, and time.  No  leg drift; finger to nose intact with repetitive questioning; expressive aphasia on exam; No pronator drift  Skin: Skin is warm and dry.  Psychiatric: He has a normal mood and affect. His behavior is normal.  Nursing note and vitals reviewed.   ED Course  Procedures (including critical care time) Epistaxis treatment Right Nair epistaxis Mild area approximately 3 mm of bleeding medial anterior right septum Silver nitrate used Bleeding controlled.  Performed by myself DIAGNOSTIC STUDIES: Oxygen Saturation is 100% on RA, normal by my interpretation.     COORDINATION OF CARE: 11:54 AM-Discussed treatment plan which includes CT scan, pain medication and follow up with PCP with pt at bedside and pt agreed to plan.    Labs Review Labs Reviewed - No data to display  Imaging Review Ct Head Wo Contrast  05/27/2015  CLINICAL DATA:  Headache.  Recent intracranial hemorrhage. EXAM: CT HEAD WITHOUT CONTRAST TECHNIQUE: Contiguous axial images were obtained from the base of the skull through the vertex without intravenous contrast. COMPARISON:  CT scan of May 17, 2015. FINDINGS: Bony calvarium appears intact. Intracranial hemorrhage measuring 3.3 x 2.0 cm is noted in left parietal and temporal lobe. This appears to be smaller compared to prior exam. 6 mm of left to right midline shift is noted. Ventricular size is within normal limits. No new hemorrhage is noted. IMPRESSION: Continued presence of left parietal and temporal lobe intracranial hemorrhage which is slightly decreased compared to prior exam. Continued presence of 6 mm of left-to-right midline shift. No new hemorrhage is noted. Electronically Signed   By: Lupita Raider, M.D.   On: 05/27/2015 13:31   I have personally reviewed and evaluated these images and lab results as part of my medical decision-making.   EKG Interpretation None      MDM   Final diagnoses:  Intracranial hemorrhage (HCC)  Essential hypertension   Patient presented for elevated blood pressure, patient is on 5 mg amlodipine at home. Patient recently discharged for intracranial hemorrhage. Fortunately patient improving neurologically per family. No other concerns on exam or in discussion with family or patient. Blood pressure remained mild elevated in the ER, pain meds given, amlodipine and clonidine. Plan for increasing amlodipine and adding clonidine when necessary with close outpatient follow-up. CT head decreased size of hemorrhage reviewed results.  Results and differential diagnosis were discussed with the  patient/parent/guardian. Xrays were independently reviewed by myself.  Close follow up outpatient was discussed, comfortable with the plan.   Medications  cloNIDine (CATAPRES) tablet 0.1 mg (not administered)  HYDROcodone-acetaminophen (NORCO/VICODIN) 5-325 MG per tablet 2 tablet (2 tablets Oral Given 05/27/15 1206)  amLODipine (NORVASC) tablet 5 mg (5 mg Oral Given 05/27/15 1206)    Filed Vitals:   05/27/15 1300 05/27/15 1330 05/27/15 1400 05/27/15 1450  BP: 164/93 155/91 150/84   Pulse: 91 84 81   Temp:    98.4 F (36.9 C)  TempSrc:    Oral  Resp: Height:      Weight:      SpO2: 98% 97% 99%     Final diagnoses:  Intracranial hemorrhage [facility])  Essential hypertension      Blane Ohara, MD 05/27/15 1414  Blane Ohara, MD 05/27/15 [phone]

## 2015-05-27 NOTE — ED Notes (Signed)
Family came out requesting to speak with  Dr. Jodi Mourning. Pt began having a spontaneous nose bleed. Dr. at bedside

## 2015-05-28 ENCOUNTER — Ambulatory Visit (HOSPITAL_COMMUNITY): Payer: BLUE CROSS/BLUE SHIELD | Admitting: Speech Pathology

## 2015-05-28 ENCOUNTER — Encounter (HOSPITAL_COMMUNITY): Payer: Self-pay | Admitting: Speech Pathology

## 2015-05-28 DIAGNOSIS — I629 Nontraumatic intracranial hemorrhage, unspecified: Secondary | ICD-10-CM | POA: Diagnosis not present

## 2015-05-28 DIAGNOSIS — R4701 Aphasia: Secondary | ICD-10-CM

## 2015-05-28 NOTE — Therapy (Signed)
[place] [facility] [address] [place], [place], [address] Phone: [phone]   Fax:  [phone]  Speech Language Pathology Evaluation  Patient Details  Name: Dakota [name] MRN: [id number] Date of Birth: [birth year] Referring Provider: Delia Heady  Encounter Date: 05/28/2015      End of Session - 05/28/15 1742    Visit Number 1   Number of Visits 24   Date for SLP Re-Evaluation 07/25/15   Authorization Type BCBS   SLP Start Time 1303   SLP Stop Time  1353   SLP Time Calculation (min) 50 min   Activity Tolerance Patient tolerated treatment well      Past Medical History  Diagnosis Date  . Hypertension   . Depression   . Stroke [facility])     History reviewed. No pertinent past surgical history.  There were no vitals filed for this visit.  Visit Diagnosis: Aphasia      Subjective Assessment - 05/28/15 1729    Subjective "OK"   Patient is accompained by: Family member   Special Tests Portions of Western Aphasia Battery and other informal aphasia measures   Currently in Pain? No/denies            SLP Evaluation [facility] - 05/28/15 1729    SLP Visit Information   SLP Received On 05/28/15   Referring Provider Delia Heady   Onset Date 05/16/2015   Medical Diagnosis intracranial hemorrhage   Subjective   Subjective "Ok"   Patient/Family Stated Goal "Speech"   General Information   HPI Dakota [name] presented to the ED on 05/16/15 s/p severe headache and confusion for 3 days. A CT of his head detected a intracranial hemmorhage of L parietal temporal lobe. He was discharged home on 05/21/15 and has been referred to speech therapy for evaluation and treatment.    Behavioral/Cognition alert and cooperative   Mobility Status Ambulatory   Prior Functional Status   Cognitive/Linguistic Baseline Within functional limits   [place] of [place]    Lives With Significant other  fiance   Available Support Family    Education Unknown -pt unable to state due to aphasia   Vocation Retired   Pain Assessment   Pain Assessment No/denies pain   Cognition   Overall Cognitive Status Impaired/Different from baseline   Area of Impairment Memory;Safety/judgement;Following commands   Memory Comments will assess further   Following Commands Follows one step commands inconsistently   Following Command Comments due to aphasia   Safety/Judgement Decreased awareness of deficits   Safety and Judgement Comments Emerging awareness of deficits   Memory --  to be assessed   Awareness Impaired   Awareness Impairment Emergent impairment   Executive Function Self Correcting;Decision Making   Auditory Comprehension   Overall Auditory Comprehension Impaired   Yes/No Questions Impaired   Basic Biographical Questions 26-50% accurate   Commands Impaired   One Step Basic Commands 50-74% accurate   Two Step Basic Commands 0-24% accurate   Conversation Simple   EffectiveTechniques Visual/Gestural cues;Stressing words;Repetition   Visual Recognition/Discrimination   Discrimination Within Function Limits   Reading Comprehension   Reading Status Impaired   Word level 51-75% accurate   Sentence Level 0-25% accurate   Paragraph Level Not tested   Functional Environmental (signs, name badge) Not tested   Expression   Primary Mode of Expression Verbal   Verbal Expression   Overall Verbal Expression Impaired   Initiation No impairment   Automatic Speech  Name;Social Response   Level of Generative/Spontaneous Verbalization Phrase   Repetition Impaired   Level of Impairment Word level   Naming Impairment   Responsive 0-25% accurate   Confrontation 0-24% accurate   Convergent 0-24% accurate   Divergent 0-24% accurate   Verbal Errors Phonemic paraphasias;Neologisms;Perseveration;Not aware of errors;Inconsistent   Pragmatics No impairment   Effective Techniques Sentence completion;Phonemic cues;Written cues  gestural cues    Non-Verbal Means of Communication Not applicable   Written Expression   Dominant Hand Right   Written Expression Not tested  wrote name only today   Oral Motor/Sensory Function   Overall Oral Motor/Sensory Function Appears within functional limits for tasks assessed   Motor Speech   Overall Motor Speech Appears within functional limits for tasks assessed   Assessment   Therapy Diagnosis Aphasia   Clinical Impression Statement (ACUTE ONLY) Patient present with global aphasia impacting both comprehension and expression. Recommend OP f/u after d/c.    SLP Recommendation/Assessment Patient needs continued Speech Lanaguage Pathology Services   Problem List Auditory comprehension;Reading comprehension;Written expression;Verbal expression;Executive Functioning   Type of Aphasia Global   Plan   Speech Therapy Frequency (ACUTE ONLY) min 3x week             SLP Short Term Goals - 05/28/15 1745    SLP SHORT TERM GOAL #1   Title Pt will increase naming of common objects/pictures to 70% acc when provided with mod/max multimodality cues   Baseline 0%   Time 8   Period Weeks   Status New   SLP SHORT TERM GOAL #2   Title Pt will complete single word sentence completion tasks (SWSC) with 70% acc when provided with mod/max multimodality cues.   Baseline 25%   Time 8   Period Weeks   Status New   SLP SHORT TERM GOAL #3   Title Pt will verbalize automatic speech tasks (counting, days, months, songs, etc) with 80% acc when provided mod/max multimodality cues   Baseline 25%   Time 8   Period Weeks   Status New   SLP SHORT TERM GOAL #4   Title Pt will complete basic level reading comprehension tasks (picture cues for sentence level) with 70% acc with provided mod cues.   Baseline 30%   Time 8   Period Weeks   Status New   SLP SHORT TERM GOAL #5   Title Pt will follow 1-step verbal commands with 80% acc when provided mod cues from SLP   Baseline 60%   Time 8   Period Weeks   Status  New          SLP Long Term Goals - 05/28/15 1746    SLP LONG TERM GOAL #1   Title Pt will communicate moderate level wants/needs/thoughts to family and friends with use of multimodality communication strategies as needed.   Time 4   Period Months   Status New          Plan - 05/28/15 1744    Clinical Impression Statement Pt presents with moderate/severe global aphasia characterized by fluent speech with neologisms and paraphasic errors with islands of appropriate/fluent automatic utterances. Pt named common objects with 0/10 correct. During picture description task, he was able to verbalize a few basic thoughts among neologisms. He showed relative strength in picture word auditory comprehension tasks, however performance inconsistent. Dakota [name] was also able to complete addition, subtraction, and multiplication portion of assessment with 100% acc. He was unable to complete the division portion which  fiance reports is different from baseline. Pt demonstrates emerging awareness of language/speech deficits, however he did not recognize errors at least 50% of the time. He benefited from gestural cues and was also able to generate gesture on his own when shown an object. Reading comprehension portion of WAB administered and pt achieved 2/8 correct. Pt hopes to improve his speech and is motivated to do so. Recommend skilled SLP therapy 2-3x/week (as scheduling permits) for aphasia therapy to maximize recovery, decrease burden of care, and provide education to pt/family. Prognosis for improvement is good with skilled therapy and home exercise program.    Speech Therapy Frequency 3x / week   Duration --  2 months   Treatment/Interventions Cueing hierarchy;Multimodal communcation approach;SLP instruction and feedback;Compensatory strategies;Patient/family education   Potential to Achieve Goals Good   Potential Considerations Severity of impairments   SLP Home Exercise Plan Pt will complete HEP as  assigned to facilitate carryover of treatment strategies and techniques in home/community environment   Consulted and Agree with Plan of Care Patient        Problem List Patient Active Problem List   Diagnosis Date Noted  . Hypertensive emergency 05/20/2015  . Hyperlipidemia 05/20/2015  . Depression 05/20/2015  . 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 [place], [place], [address] Phone: [phone]   Fax:  [phone]  Name: Dakota [name] MRN: [id number] Date of Birth: 1960-10-06

## 2015-06-03 ENCOUNTER — Ambulatory Visit (HOSPITAL_COMMUNITY): Payer: BLUE CROSS/BLUE SHIELD | Attending: Neurology | Admitting: Speech Pathology

## 2015-06-03 DIAGNOSIS — R4701 Aphasia: Secondary | ICD-10-CM | POA: Insufficient documentation

## 2015-06-03 DIAGNOSIS — I69920 Aphasia following unspecified cerebrovascular disease: Secondary | ICD-10-CM | POA: Insufficient documentation

## 2015-06-03 NOTE — Therapy (Signed)
[place] [facility] [address] [place], [place], [address] Phone: [phone]   Fax:  [phone]  Speech Language Pathology Treatment  Patient Details  Name: Dakota [name] MRN: [id number] Date of Birth: 1960-10-16 Referring Provider: Delia HeadyPramod Sethi  Encounter Date: 06/03/2015      End of Session - 06/03/15 1402    Visit Number 2   Number of Visits 24   Date for SLP Re-Evaluation 07/25/15   Authorization Type BCBS   SLP Start Time 1259   SLP Stop Time  1358   SLP Time Calculation (min) 59 min   Activity Tolerance Patient tolerated treatment well      Past Medical History  Diagnosis Date  . Hypertension   . Depression   . Stroke [facility](HCC)     No past surgical history on file.  There were no vitals filed for this visit.  Visit Diagnosis: Aphasia      Subjective Assessment - 06/03/15 1359    Subjective "I am SO SCARED!"   Patient is accompained by: Family member   Special Tests Portions of Western Aphasia Battery and other informal aphasia measures   Currently in Pain? No/denies               ADULT SLP TREATMENT - 06/03/15 1400    General Information   Behavior/Cognition Alert;Cooperative;Pleasant mood  tearful   Patient Positioning Upright in chair   Oral care provided N/A   Treatment Provided   Treatment provided Cognitive-Linquistic   Pain Assessment   Pain Assessment No/denies pain   Cognitive-Linquistic Treatment   Treatment focused on Aphasia;Patient/family/caregiver education   Skilled Treatment auditory comprehension strategies with use of written cues, multimodality communication strategies, caregiver education, word finding strategies, awareness   Assessment / Recommendations / Plan   Plan Continue with current plan of care   Progression Toward Goals   Progression toward goals Progressing toward goals            SLP Short Term Goals - 06/03/15 1409    SLP SHORT TERM GOAL #1   Title Pt will  increase naming of common objects/pictures to 70% acc when provided with mod/max multimodality cues    Baseline 0%   Time 8   Period Weeks   Status On-going   SLP SHORT TERM GOAL #2   Title Pt will complete single word sentence completion tasks (SWSC) with 70% acc when provided with mod/max multimodality cues.    Baseline 25%   Time 8   Period Weeks   Status On-going   SLP SHORT TERM GOAL #3   Title Pt will verbalize automatic speech tasks (counting, days, months, songs, etc) with 80% acc when provided mod/max multimodality cues    Baseline 25%   Time 8   Period Weeks   Status On-going   SLP SHORT TERM GOAL #4   Title Pt will complete basic level reading comprehension tasks (picture cues for sentence level) with 70% acc with provided mod cues.   Baseline 30%   Time 8   Period Weeks   Status On-going   SLP SHORT TERM GOAL #5   Title Pt will follow 1-step verbal commands with 80% acc when provided mod cues from SLP    Baseline 60%   Time 8   Period Weeks   Status On-going          SLP Long Term Goals - 06/03/15 1410    SLP LONG TERM GOAL #1   Title Pt will communicate  moderate level wants/needs/thoughts to family and friends with use of multimodality communication strategies as needed.    Baseline max assist   Time 4   Period Months   Status On-going          Plan - 06/03/15 1403    Clinical Impression Statement Dakota [name] was accompanied by Dakota fiance, Dakota [name], and Dakota [name]. Pt was intermittently tearful throughout the session and verbalized that he was "so scared". Dakota [name] reports this has been the case at home as well and she spoke to Dakota PCP and he will be going back on Dakota antidepressant. SLP discussed strategies for coping (discussing, redirecting, written affirmations) and wrote three sentences down for affirmations (It will get better, it will take time, give yourself a break...) and pt able to read aloud with cues and did respond well to each. This was referred  to throughout the session. SLP showed family how to assist in conversations by writing key words on paper and referring to as needed. Again, Dakota [name] responded positively to this method and was able to discuss current feelings, animals at home, and information regarding heat pumps. Reading comprehension task initiated requiring him to read single word and find appropriate association with f=3 which he completed with 100% acc when given moderate cues. Pt given folder with HEP to complete with Dakota [name] at home. Continue plan of care. Excellent progress noted today and pt voiced satisfaction with progress.    Speech Therapy Frequency 3x / week   Duration --  2 months   Treatment/Interventions Cueing hierarchy;Multimodal communcation approach;SLP instruction and feedback;Compensatory strategies;Patient/family education   Potential to Achieve Goals Good   Potential Considerations Severity of impairments   SLP Home Exercise Plan Pt will complete HEP as assigned to facilitate carryover of treatment strategies and techniques in home/community environment   Consulted and Agree with Plan of Care Patient        Problem List Patient Active Problem List   Diagnosis Date Noted  . Hypertensive emergency 05/20/2015  . Hyperlipidemia 05/20/2015  . Depression 05/20/2015  . 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

## 2015-06-05 ENCOUNTER — Ambulatory Visit (HOSPITAL_COMMUNITY): Payer: BLUE CROSS/BLUE SHIELD | Admitting: Speech Pathology

## 2015-06-05 DIAGNOSIS — R4701 Aphasia: Secondary | ICD-10-CM

## 2015-06-10 ENCOUNTER — Ambulatory Visit (HOSPITAL_COMMUNITY): Payer: BLUE CROSS/BLUE SHIELD | Admitting: Speech Pathology

## 2015-06-10 DIAGNOSIS — R4701 Aphasia: Secondary | ICD-10-CM | POA: Diagnosis not present

## 2015-06-10 NOTE — Therapy (Signed)
[place] [facility] [address] [place], [place], [address] Phone: [phone]   Fax:  [phone]  Speech Language Pathology Treatment  Patient Details  Name: Dakota [name] MRN: [id number] Date of Birth: Oct 28, 1960 Referring Provider: Delia Heady  Encounter Date: 06/05/2015      End of Session - 06/05/15 1534    Visit Number 3   Number of Visits 24   Date for SLP Re-Evaluation 07/25/15   Authorization Type BCBS   SLP Start Time 1258   SLP Stop Time  1353   SLP Time Calculation (min) 55 min   Activity Tolerance Patient tolerated treatment well      Past Medical History  Diagnosis Date  . Hypertension   . Depression   . Stroke [facility])     No past surgical history on file.  There were no vitals filed for this visit.  Visit Diagnosis: Aphasia      Subjective Assessment - 06/05/15 1532    Subjective "My head hurts."   Patient is accompained by: Family member   Special Tests Portions of Western Aphasia Battery and other informal aphasia measures   Currently in Pain? Yes   Pain Score 3    Pain Location Head   Pain Onset Today   Pain Relieving Factors Pt took Tylenol prior to session               ADULT SLP TREATMENT - 06/10/15 1533    General Information   Behavior/Cognition Alert;Cooperative;Pleasant mood   Patient Positioning Upright in chair   Oral care provided N/A   Treatment Provided   Treatment provided Cognitive-Linquistic   Pain Assessment   Pain Assessment No/denies pain   Cognitive-Linquistic Treatment   Treatment focused on Aphasia;Patient/family/caregiver education   Skilled Treatment auditory comprehension strategies with use of written cues, multimodality communication strategies, caregiver education, word finding strategies, awareness   Assessment / Recommendations / Plan   Plan Continue with current plan of care   Progression Toward Goals   Progression toward goals Progressing toward goals           SLP Education - 06/10/15 1533    Education provided Yes   Education Details Strategies for increased communication at home   Person(s) Educated Patient;Spouse;Caregiver(s)   Methods Explanation;Demonstration;Handout   Comprehension Verbalized understanding          SLP Short Term Goals - 06/10/15 1535    SLP SHORT TERM GOAL #1   Title Pt will increase naming of common objects/pictures to 70% acc when provided with mod/max multimodality cues    Baseline 0%   Time 8   Period Weeks   Status On-going   SLP SHORT TERM GOAL #2   Title Pt will complete single word sentence completion tasks (SWSC) with 70% acc when provided with mod/max multimodality cues.    Baseline 25%   Time 8   Period Weeks   Status On-going   SLP SHORT TERM GOAL #3   Title Pt will verbalize automatic speech tasks (counting, days, months, songs, etc) with 80% acc when provided mod/max multimodality cues    Baseline 25%   Time 8   Period Weeks   Status On-going   SLP SHORT TERM GOAL #4   Title Pt will complete basic level reading comprehension tasks (picture cues for sentence level) with 70% acc with provided mod cues.   Baseline 30%   Time 8   Period Weeks   Status On-going   SLP SHORT TERM GOAL #  5   Title Pt will follow 1-step verbal commands with 80% acc when provided mod cues from SLP    Baseline 60%   Time 8   Period Weeks   Status On-going          SLP Long Term Goals - 06/10/15 1535    SLP LONG TERM GOAL #1   Title Pt will communicate moderate level wants/needs/thoughts to family and friends with use of multimodality communication strategies as needed.    Baseline max assist   Time 4   Period Months   Status On-going          Plan - 06/10/15 1535    Clinical Impression Statement Dakota [name] was accompanied by his fiance, Dakota [name], and his brother. He reports feeling less anxious and scared today and seems more optimistic. SLP reminded pt that he may experience ups and downs  with his recovery and that it was important to share his feelings with his support system. SLP facilitated session by targeting word finding and expressive language in functional conversations. Pt benefited from paper between us with SLP writing down key words from conversation to help structure discourse and assist with auditory comprehension and word recall. Continue plan of care. Excellent progress noted today and pt voiced satisfaction with progress.    Speech Therapy Frequency 3x / week   Duration --  2 months   Treatment/Interventions Cueing hierarchy;Multimodal communcation approach;SLP instruction and feedback;Compensatory strategies;Patient/family education   Potential to Achieve Goals Good   Potential Considerations Severity of impairments   SLP Home Exercise Plan Pt will complete HEP as assigned to facilitate carryover of treatment strategies and techniques in home/community environment   Consulted and Agree with Plan of Care Patient        Problem List Patient Active Problem List   Diagnosis Date Noted  . Hypertensive emergency 05/20/2015  . Hyperlipidemia 05/20/2015  . Depression 05/20/2015  . 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

## 2015-06-10 NOTE — Therapy (Signed)
[facility] [address] [place], [place], [address] Phone: [phone]   Fax:  [phone]  Speech Language Pathology Treatment  Patient Details  Name: Dakota [name] MRN: [id number] Date of Birth: Nov 22, 1960 Referring Provider: Delia Heady  Encounter Date: 06/10/2015      End of Session - 06/10/15 1534    Visit Number 3   Number of Visits 24   Date for SLP Re-Evaluation 07/25/15   Authorization Type BCBS   SLP Start Time 1258   SLP Stop Time  1353   SLP Time Calculation (min) 55 min   Activity Tolerance Patient tolerated treatment well      Past Medical History  Diagnosis Date  . Hypertension   . Depression   . Stroke [facility])     No past surgical history on file.  There were no vitals filed for this visit.  Visit Diagnosis: Aphasia      Subjective Assessment - 06/10/15 1532    Subjective "My head hurts."   Patient is accompained by: Family member   Special Tests Portions of Western Aphasia Battery and other informal aphasia measures   Currently in Pain? Yes   Pain Score 3    Pain Location Head   Pain Onset Today   Pain Relieving Factors Pt took Tylenol prior to session               ADULT SLP TREATMENT - 06/10/15 1533    General Information   Behavior/Cognition Alert;Cooperative;Pleasant mood   Patient Positioning Upright in chair   Oral care provided N/A   Treatment Provided   Treatment provided Cognitive-Linquistic   Pain Assessment   Pain Assessment No/denies pain   Cognitive-Linquistic Treatment   Treatment focused on Aphasia;Patient/family/caregiver education   Skilled Treatment auditory comprehension strategies with use of written cues, multimodality communication strategies, caregiver education, word finding strategies, awareness   Assessment / Recommendations / Plan   Plan Continue with current plan of care   Progression Toward Goals   Progression toward goals Progressing toward goals           SLP Education - 06/10/15 1533    Education provided Yes   Education Details Strategies for increased communication at home   Person(s) Educated Patient;Spouse;Caregiver(s)   Methods Explanation;Demonstration;Handout   Comprehension Verbalized understanding          SLP Short Term Goals - 06/10/15 1535    SLP SHORT TERM GOAL #1   Title Pt will increase naming of common objects/pictures to 70% acc when provided with mod/max multimodality cues    Baseline 0%   Time 8   Period Weeks   Status On-going   SLP SHORT TERM GOAL #2   Title Pt will complete single word sentence completion tasks (SWSC) with 70% acc when provided with mod/max multimodality cues.    Baseline 25%   Time 8   Period Weeks   Status On-going   SLP SHORT TERM GOAL #3   Title Pt will verbalize automatic speech tasks (counting, days, months, songs, etc) with 80% acc when provided mod/max multimodality cues    Baseline 25%   Time 8   Period Weeks   Status On-going   SLP SHORT TERM GOAL #4   Title Pt will complete basic level reading comprehension tasks (picture cues for sentence level) with 70% acc with provided mod cues.   Baseline 30%   Time 8   Period Weeks   Status On-going   SLP SHORT TERM GOAL #  5   Title Pt will follow 1-step verbal commands with 80% acc when provided mod cues from SLP    Baseline 60%   Time 8   Period Weeks   Status On-going          SLP Long Term Goals - 06/10/15 1535    SLP LONG TERM GOAL #1   Title Pt will communicate moderate level wants/needs/thoughts to family and friends with use of multimodality communication strategies as needed.    Baseline max assist   Time 4   Period Months   Status On-going          Plan - 06/10/15 1535    Clinical Impression Statement [name] was accompanied by his fiance and brother. They all continue to report fluctuations in [name] emotions. His medication has been changed and he reports sleeping better, but still  has times when he feels very anxious and "so scared". He was encouraged to listen to music, go for a drive with his brother, and "tinker" in his garage as he enjoys these activities. He completed single word sentence completion tasks (written) with 90% acc with min cues. He required mild/mod assist for accurate verbal productions. His awareness improves every day and he attempts to self correct. He continues to benefit from written cues to help with comprehension and expression in conversations.    Speech Therapy Frequency 3x / week   Duration --  2 months   Treatment/Interventions Cueing hierarchy;Multimodal communcation approach;SLP instruction and feedback;Compensatory strategies;Patient/family education   Potential to Achieve Goals Good   Potential Considerations Severity of impairments   SLP Home Exercise Plan Pt will complete HEP as assigned to facilitate carryover of treatment strategies and techniques in home/community environment   Consulted and Agree with Plan of Care Patient        Problem List Patient Active Problem List   Diagnosis Date Noted  . Hypertensive emergency 05/20/2015  . Hyperlipidemia 05/20/2015  . Depression 05/20/2015  . 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

## 2015-06-12 ENCOUNTER — Ambulatory Visit (HOSPITAL_COMMUNITY): Payer: BLUE CROSS/BLUE SHIELD | Admitting: Speech Pathology

## 2015-06-12 DIAGNOSIS — R4701 Aphasia: Secondary | ICD-10-CM | POA: Diagnosis not present

## 2015-06-12 DIAGNOSIS — IMO0002 Reserved for concepts with insufficient information to code with codable children: Secondary | ICD-10-CM

## 2015-06-12 NOTE — Therapy (Signed)
[place], [place], [address] Phone: [phone]   Fax:  [phone]  Speech Language Pathology Treatment  Patient Details  Name: Dakota [name] MRN: [id number] Date of Birth: 1961/02/14 Referring Provider: Antony Contras  Encounter Date: 06/12/2015      End of Session - 06/12/15 1256    Visit Number 4   Number of Visits 24   Date for SLP Re-Evaluation 07/25/15   Authorization Type BCBS   SLP Start Time 1112   SLP Stop Time  1215   SLP Time Calculation (min) 63 min   Activity Tolerance Patient tolerated treatment well      Past Medical History  Diagnosis Date  . Hypertension   . Depression   . Stroke [facility])     No past surgical history on file.  There were no vitals filed for this visit.  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 No/denies               ADULT SLP TREATMENT - 06/12/15 1256    General Information   Behavior/Cognition Alert;Cooperative;Pleasant mood   Patient Positioning Upright in chair   Oral care provided N/A   Treatment Provided   Treatment provided Cognitive-Linquistic   Pain Assessment   Pain Assessment No/denies pain   Cognitive-Linquistic Treatment   Treatment focused on Aphasia;Patient/family/caregiver education   Skilled Treatment auditory comprehension strategies with use of written cues, multimodality communication strategies, caregiver education, word finding strategies, awareness   Assessment / Recommendations / Plan   Plan Continue with current plan of care   Progression Toward Goals   Progression toward goals Progressing toward goals            SLP Short Term Goals - 06/12/15 1257     SLP SHORT TERM GOAL #1   Title Pt will increase naming of common objects/pictures to 70% acc when provided with mod/max multimodality cues    Baseline 0%   Time 8   Period Weeks   Status On-going   SLP SHORT TERM GOAL #2   Title Pt will complete single word sentence completion tasks ([place]) with 70% acc when provided with mod/max multimodality cues.    Baseline 25%   Time 8   Period Weeks   Status On-going   SLP SHORT TERM GOAL #3   Title Pt will verbalize automatic speech tasks (counting, days, months, songs, etc) with 80% acc when provided mod/max multimodality cues    Baseline 25%   Time 8   Period Weeks   Status On-going   SLP SHORT TERM GOAL #4   Title Pt will complete basic level reading comprehension tasks (picture cues for sentence level) with 70% acc with provided mod cues.   Baseline 30%   Time 8   Period Weeks   Status On-going   SLP SHORT TERM GOAL #5   Title Pt will follow 1-step verbal commands with 80% acc when provided mod cues from SLP    Baseline 60%   Time 8   Period Weeks   Status On-going          SLP Long Term Goals - 06/12/15 1257    SLP LONG TERM  GOAL #1   Title Pt will communicate moderate level wants/needs/thoughts to family and friends with use of multimodality communication strategies as needed.    Baseline max assist   Time 4   Period Months   Status On-going          Plan - 06/12/15 1257    Clinical Impression Statement Pt again accompanied by his fiance and brother. Pt verbalized appreciation for speech therapy and acknowledged that he's "getting better". Today's session focused on targeting naming in barrier task and using word finding strategies. Pt asked to name a picture if he could and if not, use word finding strategies in attempt to have his fiance guess the word. He benefited from written cues on index cards to aid with comprehension. Written cues included: "Do you like them?, Where would I find them?, What does it look like?"  etc. He was able to name pictures 25% of the time, but was able to convey meaning (ie. Fiance able to guess object) 75% of the time with min cues. Next session will target specific words/objects from the [facility] kit.   Speech Therapy Frequency 3x / week   Duration --  2 months   Treatment/Interventions Cueing hierarchy;Multimodal communcation approach;SLP instruction and feedback;Compensatory strategies;Patient/family education   Potential to Achieve Goals Good   Potential Considerations Severity of impairments   SLP Home Exercise Plan Pt will complete HEP as assigned to facilitate carryover of treatment strategies and techniques in home/community environment   Consulted and Agree with Plan of Care Patient        Problem List Patient Active Problem List   Diagnosis Date Noted  . Hypertensive emergency 05/20/2015  . Hyperlipidemia 05/20/2015  . Depression 05/20/2015  . 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

## 2015-06-13 ENCOUNTER — Ambulatory Visit (HOSPITAL_COMMUNITY): Payer: BLUE CROSS/BLUE SHIELD | Admitting: Speech Pathology

## 2015-06-13 DIAGNOSIS — R4701 Aphasia: Secondary | ICD-10-CM | POA: Diagnosis not present

## 2015-06-13 DIAGNOSIS — IMO0002 Reserved for concepts with insufficient information to code with codable children: Secondary | ICD-10-CM

## 2015-06-13 NOTE — Therapy (Signed)
[place] [facility] [address] [place], [place], [address] Phone: [phone]   Fax:  [phone]  Speech Language Pathology Treatment  Patient Details  Name: Wendy Mikles MRN: [id number] Date of Birth: 06-Mar-1961 Referring Provider: Delia Heady  Encounter Date: 06/13/2015      End of Session - 06/13/15 1256    Visit Number 6   Number of Visits 24   Date for SLP Re-Evaluation 07/25/15   Authorization Type BCBS   SLP Start Time 1600   SLP Stop Time  1700   SLP Time Calculation (min) 60 min   Activity Tolerance Patient tolerated treatment well      Past Medical History  Diagnosis Date  . Hypertension   . Depression   . Stroke [facility])     No past surgical history on file.  There were no vitals filed for this visit.  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 Pryor presented to the ED on 05/16/15 s/p severe headache and confusion for 3 days. A CT of his head detected a intracranial hemmorhage of L parietal temporal lobe. He was discharged home on 05/21/15 and has been referred to speech therapy for evaluation and treatment.    Treatment Provided   Treatment provided Cognitive-Linquistic   Pain Assessment   Pain Assessment No/denies pain   Cognitive-Linquistic Treatment   Treatment focused on Aphasia;Patient/family/caregiver education   Skilled Treatment auditory comprehension strategies with use of written cues, multimodality communication strategies, caregiver  education, word finding strategies, awareness   Assessment / Recommendations / Plan   Plan Continue with current plan of care   Progression Toward Goals   Progression toward goals Progressing toward goals            SLP Short Term Goals - 06/12/15 1257    SLP SHORT TERM GOAL #1   Title Pt will increase naming of common objects/pictures to 70% acc when provided with mod/max multimodality cues    Baseline 0%   Time 8   Period Weeks   Status On-going   SLP SHORT TERM GOAL #2   Title Pt will complete single word sentence completion tasks (SWSC) with 70% acc when provided with mod/max multimodality cues.    Baseline 25%   Time 8   Period Weeks   Status On-going   SLP SHORT TERM GOAL #3   Title Pt will verbalize automatic speech tasks (counting, days, months, songs, etc) with 80% acc when provided mod/max multimodality cues    Baseline 25%   Time 8   Period Weeks   Status On-going   SLP SHORT TERM GOAL #4   Title Pt will complete basic level reading comprehension tasks (picture cues for sentence level) with 70% acc with provided mod cues.   Baseline 30%   Time 8   Period Weeks   Status On-going   SLP SHORT TERM GOAL #5   Title Pt will follow 1-step verbal commands with 80% acc  when provided mod cues from SLP    Baseline 60%   Time 8   Period Weeks   Status On-going          SLP Long Term Goals - 06/12/15 1257    SLP LONG TERM GOAL #1   Title Pt will communicate moderate level wants/needs/thoughts to family and friends with use of multimodality communication strategies as needed.    Baseline max assist   Time 4   Period Months   Status On-going          Plan - 06/12/15 1257    Clinical Impression Statement [name] was accompanied by his fiance, [name], and his brother. LARK object presented for confrontation naming and pt named 2/22 correctly. Paraphasic errors noted with related responses. Pt able to gesture function of object with 100% acc with min cues.  Pt able match written word to object with 88% acc. Reading comprehension task initiated requiring him to read single word and find appropriate association with f=3 which he completed with 100% acc when given moderate cues. Pt given folder with HEP to complete with [name] at home. Continue plan of care. Excellent progress noted today and pt voiced satisfaction with progress.    Speech Therapy Frequency 3x / week   Duration --  2 months   Treatment/Interventions Cueing hierarchy;Multimodal communcation approach;SLP instruction and feedback;Compensatory strategies;Patient/family education   Potential to Achieve Goals Good   Potential Considerations Severity of impairments   SLP Home Exercise Plan Pt will complete HEP as assigned to facilitate carryover of treatment strategies and techniques in home/community environment   Consulted and Agree with Plan of Care Patient        Problem List Patient Active Problem List   Diagnosis Date Noted  . Hypertensive emergency 05/20/2015  . Hyperlipidemia 05/20/2015  . Depression 05/20/2015  . 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 [place], [place], [address] Phone: [phone]   Fax:  [phone]   Name: Sherryll BurgerLarry Ehrman MRN: [id number] Date of Birth: December 18, 1960

## 2015-06-17 ENCOUNTER — Ambulatory Visit (HOSPITAL_COMMUNITY): Payer: BLUE CROSS/BLUE SHIELD | Admitting: Speech Pathology

## 2015-06-17 DIAGNOSIS — IMO0002 Reserved for concepts with insufficient information to code with codable children: Secondary | ICD-10-CM

## 2015-06-17 DIAGNOSIS — R4701 Aphasia: Secondary | ICD-10-CM | POA: Diagnosis not present

## 2015-06-17 NOTE — Therapy (Signed)
[facility] [address] [place], [place], [address] Phone: [phone]   Fax:  [phone]  Speech Language Pathology Treatment  Patient Details  Name: Dakota [name] MRN: [id number] Date of Birth: 12-08-1960 Referring Provider: Delia HeadyPramod Sethi  Encounter Date: 06/17/2015      End of Session - 06/17/15 2241    Visit Number 5   Number of Visits 24   Date for SLP Re-Evaluation 07/25/15   Authorization Type BCBS   SLP Start Time 1435   SLP Stop Time  1522   SLP Time Calculation (min) 47 min   Activity Tolerance Patient tolerated treatment well      Past Medical History  Diagnosis Date  . Hypertension   . Depression   . Stroke [facility](HCC)     No past surgical history on file.  There were no vitals filed for this visit.  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 No/denies               ADULT SLP TREATMENT - 06/17/15 1940    General Information   Behavior/Cognition Alert;Cooperative;Pleasant mood   Patient Positioning Upright in chair   Oral care provided N/A   HPI [name] presented to the ED on 05/16/15 s/p severe headache and confusion for 3 days. A CT of his head detected a intracranial hemmorhage of L parietal temporal lobe. He was discharged home on 05/21/15 and has been referred to speech therapy for evaluation and treatment.    Treatment Provided   Treatment provided Cognitive-Linquistic   Pain Assessment   Pain Assessment No/denies pain   Cognitive-Linquistic Treatment   Treatment focused on Aphasia;Patient/family/caregiver education   Skilled Treatment auditory comprehension strategies with use of written cues, multimodality  communication strategies, caregiver education, word finding strategies, awareness   Assessment / Recommendations / Plan   Plan Continue with current plan of care   Progression Toward Goals   Progression toward goals Progressing toward goals            SLP Short Term Goals - 06/17/15 2242    SLP SHORT TERM GOAL #1   Title Pt will increase naming of common objects/pictures to 70% acc when provided with mod/max multimodality cues    Baseline 0%   Time 8   Period Weeks   Status On-going   SLP SHORT TERM GOAL #2   Title Pt will complete single word sentence completion tasks (SWSC) with 70% acc when provided with mod/max multimodality cues.    Baseline 25%   Time 8   Period Weeks   Status On-going   SLP SHORT TERM GOAL #3   Title Pt will verbalize automatic speech tasks (counting, days, months, songs, etc) with 80% acc when provided mod/max multimodality cues    Baseline 25%   Time 8   Period Weeks   Status On-going   SLP SHORT TERM GOAL #4   Title Pt will complete basic level reading comprehension tasks (picture cues for sentence level) with 70% acc with provided mod cues.   Baseline 30%   Time 8   Period Weeks   Status On-going   SLP SHORT TERM GOAL #5   Title Pt will follow  1-step verbal commands with 80% acc when provided mod cues from SLP    Baseline 60%   Time 8   Period Weeks   Status On-going          SLP Long Term Goals - 06/17/15 2242    SLP LONG TERM GOAL #1   Title Pt will communicate moderate level wants/needs/thoughts to family and friends with use of multimodality communication strategies as needed.    Baseline max assist   Time 4   Period Months   Status On-going          Plan - 06/17/15 2242    Clinical Impression Statement Dakota [name] was accompanied by his fiance and brother. He reports increased fatigue and was said to have worked on his speech homework for over 2 hours. SLP encouraged pt to take rest breaks and find activities that are  relaxing and enjoyable as it was like running a marathon to his brain right now. Pt appreciative and verbalized that he would "take it easy".  He had more difficulty with automatic speech tasks today, however I think this is because he is more aware of errors and is more critical so he stops himself frequently now during verbalizations. I shared that this is a sign of progress, but that it was not unusual to become more depressed with the realization of his speech errors. Paraphasic errors seemed more apraxic in nature today so will further assess next session. His errors are more related to inflection, stress, and intonation. He did very well with CV apraxia drills and was encouraged to practice those at home.    Speech Therapy Frequency 3x / week   Duration --  2 months   Treatment/Interventions Cueing hierarchy;Multimodal communcation approach;SLP instruction and feedback;Compensatory strategies;Patient/family education   Potential to Achieve Goals Good   Potential Considerations Severity of impairments   SLP Home Exercise Plan Pt will complete HEP as assigned to facilitate carryover of treatment strategies and techniques in home/community environment   Consulted and Agree with Plan of Care Patient        Problem List Patient Active Problem List   Diagnosis Date Noted  . Hypertensive emergency 05/20/2015  . Hyperlipidemia 05/20/2015  . Depression 05/20/2015  . 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 [place], [place], [address] Phone: [phone]   Fax:  [phone]   Name: Dakota [name] MRN: [id number] Date of Birth: 02/20/1961

## 2015-06-20 ENCOUNTER — Ambulatory Visit (HOSPITAL_COMMUNITY): Payer: BLUE CROSS/BLUE SHIELD | Admitting: Speech Pathology

## 2015-06-20 DIAGNOSIS — IMO0002 Reserved for concepts with insufficient information to code with codable children: Secondary | ICD-10-CM

## 2015-06-20 DIAGNOSIS — R4701 Aphasia: Secondary | ICD-10-CM | POA: Diagnosis not present

## 2015-06-20 NOTE — Therapy (Signed)
[facility] [address] [place], [place], [address] Phone: [phone]   Fax:  [phone]  Speech Language Pathology Treatment  Patient Details  Name: Dakota [name] MRN: [id number] Date of Birth: 1960/11/10 Referring Provider: Delia Heady  Encounter Date: 06/20/2015      End of Session - 06/20/15 1801    Visit Number 8   Number of Visits 24   Date for SLP Re-Evaluation 07/25/15   Authorization Type BCBS   SLP Start Time 1600   SLP Stop Time  1715   SLP Time Calculation (min) 75 min   Activity Tolerance Patient tolerated treatment well      Past Medical History  Diagnosis Date  . Hypertension   . Depression   . Stroke [facility])     No past surgical history on file.  There were no vitals filed for this visit.  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 No/denies               ADULT SLP TREATMENT - 06/20/15 1800    General Information   Behavior/Cognition Alert;Cooperative;Pleasant mood   Patient Positioning Upright in chair   Oral care provided N/A   HPI Mr. Bremer presented to the ED on 05/16/15 s/p severe headache and confusion for 3 days. A CT of his head detected a intracranial hemmorhage of L parietal temporal lobe. He was discharged home on 05/21/15 and has been referred to speech therapy for evaluation and treatment.    Treatment Provided   Treatment provided Cognitive-Linquistic   Pain Assessment   Pain Assessment No/denies pain   Cognitive-Linquistic Treatment   Treatment focused on Aphasia;Patient/family/caregiver education   Skilled Treatment auditory comprehension strategies with use of written cues, multimodality communication strategies,  caregiver education, word finding strategies, awareness   Assessment / Recommendations / Plan   Plan Continue with current plan of care   Progression Toward Goals   Progression toward goals Progressing toward goals            SLP Short Term Goals - 06/20/15 1810    SLP SHORT TERM GOAL #1   Title Pt will increase naming of common objects/pictures to 70% acc when provided with mod/max multimodality cues    Baseline 0%   Time 8   Period Weeks   Status On-going   SLP SHORT TERM GOAL #2   Title Pt will complete single word sentence completion tasks (SWSC) with 70% acc when provided with mod/max multimodality cues.    Baseline 25%   Time 8   Period Weeks   Status On-going   SLP SHORT TERM GOAL #3   Title Pt will verbalize automatic speech tasks (counting, days, months, songs, etc) with 80% acc when provided mod/max multimodality cues    Baseline 25%   Time 8   Period Weeks   Status On-going   SLP SHORT TERM GOAL #4   Title Pt will complete basic level reading comprehension tasks (picture cues for sentence level) with 70% acc with provided mod cues.   Baseline 30%   Time 8   Period Weeks   Status On-going   SLP SHORT TERM GOAL #5   Title Pt will follow 1-step verbal commands with 80% acc  when provided mod cues from SLP    Baseline 60%   Time 8   Period Weeks   Status On-going          SLP Long Term Goals - 06/20/15 1810    SLP LONG TERM GOAL #1   Title Pt will communicate moderate level wants/needs/thoughts to family and friends with use of multimodality communication strategies as needed.    Baseline max assist   Time 4   Period Months   Status On-going          Plan - 06/20/15 1801    Clinical Impression Statement [name] was accompanied by [name] and his brother, Tinnie GensJeffrey today. He was excited to tell me "screwdriver" as he had difficulty last session saying that. Once in the room, he quickly escalated into a near panic. He was tearful, turned very red in  the face, and voiced feeling "terrible". [name] reported that if his Xanax wears off, he quickly does this. He reports not wanting to take the Xanax because he doesn't want to "become a druggie". I explained to him that his doctor wrote the prescription for a reason and that he should take it until his doctor told him not to take it. He was reassured that his brother and fiance would be there to support him. He described feeling depressed (started an antidepressant about 3 weeks ago) and questioned whether he wanted to live on some days. [name] was able to give him 1/2 a Xanax and he showed signs of feeling calmer and he expressed the same. He has a strong desire to be useful and does not like to sit around the house. He was encouraged to go to the garage with his brother and work on cars again. I have no concerns about his vision, problem solving, reaction time, or his motor skills. His deficits appear to be specific to language (verbal expression and auditory comprehension which are improving). I do have concerns about his emotional state and encouraged family to stay with him, which they do. He would like to drive again and from my perspective he would be ok to drive, but he was encouraged to get clearance from his doctor when he sees her in April.    Speech Therapy Frequency 3x / week   Duration --  2 months   Treatment/Interventions Cueing hierarchy;Multimodal communcation approach;SLP instruction and feedback;Compensatory strategies;Patient/family education   Potential to Achieve Goals Good   Potential Considerations Severity of impairments   SLP Home Exercise Plan Pt will complete HEP as assigned to facilitate carryover of treatment strategies and techniques in home/community environment   Consulted and Agree with Plan of Care Patient        Problem List Patient Active Problem List   Diagnosis Date Noted  . Hypertensive emergency 05/20/2015  . Hyperlipidemia 05/20/2015  . Depression  05/20/2015  . 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

## 2015-06-24 ENCOUNTER — Ambulatory Visit (HOSPITAL_COMMUNITY): Payer: BLUE CROSS/BLUE SHIELD | Admitting: Speech Pathology

## 2015-06-24 DIAGNOSIS — IMO0002 Reserved for concepts with insufficient information to code with codable children: Secondary | ICD-10-CM

## 2015-06-24 DIAGNOSIS — R4701 Aphasia: Secondary | ICD-10-CM | POA: Diagnosis not present

## 2015-06-25 ENCOUNTER — Ambulatory Visit (HOSPITAL_COMMUNITY): Payer: BLUE CROSS/BLUE SHIELD | Admitting: Speech Pathology

## 2015-06-25 NOTE — Therapy (Signed)
[facility] [address] [place], [place], [address] Phone: [phone]   Fax:  [phone]  Speech Language Pathology Treatment  Patient Details  Name: Dakota BurgerLarry [name] MRN: [id number] Date of Birth: 29-Jul-1960 Referring Provider: Delia HeadyPramod Sethi  Encounter Date: 06/24/2015      End of Session - 06/24/15 1832    Visit Number 9   Number of Visits 24   Date for SLP Re-Evaluation 07/25/15   Authorization Type BCBS   SLP Start Time 1355   SLP Stop Time  1440   SLP Time Calculation (min) 45 min   Activity Tolerance Patient tolerated treatment well      Past Medical History  Diagnosis Date  . Hypertension   . Depression   . Stroke [facility](HCC)     No past surgical history on file.  There were no vitals filed for this visit.  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 No/denies               ADULT SLP TREATMENT - 06/24/15 1830    General Information   Behavior/Cognition Alert;Cooperative;Pleasant mood   Patient Positioning Upright in chair   Oral care provided N/A   HPI [name] presented to the ED on 05/16/15 s/p severe headache and confusion for 3 days. A CT of his head detected a intracranial hemmorhage of L parietal temporal lobe. He was discharged home on 05/21/15 and has been referred to speech therapy for evaluation and treatment.    Treatment Provided   Treatment provided Cognitive-Linquistic   Pain Assessment   Pain Assessment No/denies pain   Cognitive-Linquistic Treatment   Treatment focused on Aphasia;Patient/family/caregiver education   Skilled Treatment auditory comprehension strategies with use of written cues, multimodality communication strategies,  caregiver education, word finding strategies, awareness   Assessment / Recommendations / Plan   Plan Continue with current plan of care   Progression Toward Goals   Progression toward goals Progressing toward goals            SLP Short Term Goals - 06/24/15 1833    SLP SHORT TERM GOAL #1   Title Pt will increase naming of common objects/pictures to 70% acc when provided with mod/max multimodality cues    Baseline 0%   Time 8   Period Weeks   Status On-going   SLP SHORT TERM GOAL #2   Title Pt will complete single word sentence completion tasks (SWSC) with 70% acc when provided with mod/max multimodality cues.    Baseline 25%   Time 8   Period Weeks   Status On-going   SLP SHORT TERM GOAL #3   Title Pt will verbalize automatic speech tasks (counting, days, months, songs, etc) with 80% acc when provided mod/max multimodality cues    Baseline 25%   Time 8   Period Weeks   Status On-going   SLP SHORT TERM GOAL #4   Title Pt will complete basic level reading comprehension tasks (picture cues for sentence level) with 70% acc with provided mod cues.   Baseline 30%   Time 8   Period Weeks   Status On-going   SLP SHORT TERM GOAL #5   Title Pt will follow 1-step verbal commands with 80% acc  when provided mod cues from SLP    Baseline 60%   Time 8   Period Weeks   Status On-going          SLP Long Term Goals - 06/24/15 1834    SLP LONG TERM GOAL #1   Title Pt will communicate moderate level wants/needs/thoughts to family and friends with use of multimodality communication strategies as needed.    Baseline max assist   Time 4   Period Months   Status On-going          Plan - 06/24/15 1833    Clinical Impression Statement Mr. Hixon was accompanied by his brother, Tinnie Gens today. He was excited to tell me that he went to the shop over the weekend and sat on his motorcycle for a picture. His brother took him out driving on a country road with no cars and he did very  well per Jeffrey's report. He reported a "terrible headache" over the weekend and stated that a hot shower relieved the symptoms. Verbal fluency continues to improve and overall awareness of errors is improving as well. He was able to name common objects in room with 75% acc with allowance for approximations and self corrections. New reading comprehension task was introduced which required him to read single words and place in appropriate category which he completed with 80% acc with min cues. Overall outlook and mood was excellent today. Continue with POC.    Speech Therapy Frequency 3x / week   Duration --  2 months   Treatment/Interventions Cueing hierarchy;Multimodal communcation approach;SLP instruction and feedback;Compensatory strategies;Patient/family education   Potential to Achieve Goals Good   Potential Considerations Severity of impairments   SLP Home Exercise Plan Pt will complete HEP as assigned to facilitate carryover of treatment strategies and techniques in home/community environment   Consulted and Agree with Plan of Care Patient        Problem List Patient Active Problem List   Diagnosis Date Noted  . Hypertensive emergency 05/20/2015  . Hyperlipidemia 05/20/2015  . Depression 05/20/2015  . 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 [place], [place], [address] Phone: [phone]   Fax:  [phone]   Name: Dakota [name] MRN: [id number] Date of Birth: 1960-04-15

## 2015-06-26 ENCOUNTER — Ambulatory Visit (HOSPITAL_COMMUNITY): Payer: BLUE CROSS/BLUE SHIELD | Admitting: Speech Pathology

## 2015-06-26 ENCOUNTER — Encounter (HOSPITAL_COMMUNITY): Payer: Self-pay | Admitting: Speech Pathology

## 2015-06-26 DIAGNOSIS — IMO0002 Reserved for concepts with insufficient information to code with codable children: Secondary | ICD-10-CM

## 2015-06-26 DIAGNOSIS — R4701 Aphasia: Secondary | ICD-10-CM | POA: Diagnosis not present

## 2015-06-26 NOTE — Therapy (Signed)
[place], [place], [address] Phone: [phone]   Fax:  [phone]  Speech Language Pathology Treatment  Patient Details  Name: Dakota [name] MRN: [id number] Date of Birth: 1960/10/21 Referring Provider: Antony Contras  Encounter Date: 06/26/2015      End of Session - 06/26/15 1215    Visit Number 10   Number of Visits 24   Date for SLP Re-Evaluation 07/25/15   Authorization Type BCBS   SLP Start Time 1048   SLP Stop Time  1148   SLP Time Calculation (min) 60 min   Activity Tolerance Patient tolerated treatment well      Past Medical History  Diagnosis Date  . Hypertension   . Depression   . Stroke [facility])     History reviewed. No pertinent past surgical history.  There were no vitals filed for this visit.  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 No/denies               ADULT SLP TREATMENT - 06/26/15 1213    General Information   Behavior/Cognition Alert;Cooperative;Pleasant mood   Patient Positioning Upright in chair   Oral care provided N/A   HPI Dakota [name] presented to the ED on 05/16/15 s/p severe headache and confusion for 3 days. A CT of his head detected a intracranial hemmorhage of L parietal temporal lobe. He was discharged home on 05/21/15 and has been referred to speech therapy for evaluation and treatment.    Treatment Provided   Treatment provided Cognitive-Linquistic   Pain Assessment   Pain Assessment No/denies pain   Cognitive-Linquistic Treatment   Treatment focused on Aphasia;Patient/family/caregiver education   Skilled Treatment auditory comprehension strategies with use of written cues,  multimodality communication strategies, caregiver education, word finding strategies, awareness   Assessment / Recommendations / [place] with current plan of care   Progression Toward Goals   Progression toward goals Progressing toward goals          SLP Education - 06/26/15 1214    Education provided Yes   Education Details Emphasized importance of: sleep, steady nutrition, medication, water intake, rest breaks, and decreasing stress for his recovery   Person(s) Educated Patient;Spouse   Methods Explanation;Verbal cues   Comprehension Verbalized understanding          SLP Short Term Goals - 06/26/15 1224    SLP SHORT TERM GOAL #1   Title Pt will increase naming of common objects/pictures to 70% acc when provided with mod/max multimodality cues    Baseline 0%   Time 8   Period Weeks   Status On-going   SLP SHORT TERM GOAL #2   Title Pt will complete single word sentence completion tasks ([place]) with 70% acc when provided with mod/max multimodality cues.    Baseline 25%   Time 8   Period Weeks   Status On-going   SLP SHORT TERM GOAL #3   Title Pt will verbalize automatic speech tasks (counting, days, months, songs, etc) with 80% acc when provided mod/max multimodality cues    Baseline 25%   Time 8   Period Weeks   Status On-going   SLP SHORT TERM GOAL #  4   Title Pt will complete basic level reading comprehension tasks (picture cues for sentence level) with 70% acc with provided mod cues.   Baseline 30%   Time 8   Period Weeks   Status On-going   SLP SHORT TERM GOAL #5   Title Pt will follow 1-step verbal commands with 80% acc when provided mod cues from SLP    Baseline 60%   Time 8   Period Weeks   Status On-going          SLP Long Term Goals - 06/26/15 1224    SLP LONG TERM GOAL #1   Title Pt will communicate moderate level wants/needs/thoughts to family and friends with use of multimodality communication strategies as needed.    Baseline max  assist   Time 4   Period Months   Status On-going          Plan - 06/26/15 1215    Clinical Impression Statement Dakota [name] was accompanied by his fiance, [name] today. He was able to tell me that he went to the shop with his brother yesterday and saw some old friends. He verbalized that he is taking 1/2 of his "peach pill" at 8 am and waiting until 1 PM to take another, but started to feel very sick yesterday when too much time had passed wtithout taking his medication and eating something. SLP reinforced need to take medications as prescribed (he is nervous about getting "adicted"), eat/drink regularly, and take rest breaks. He verbalized understanding and was appreciative of feedback. Confrontation naming targeted with 26 items from [facility] kit. He labeled 13/26 (50%) independently without delay and with full accuracy; 22/26 (85%) when allowed for close approximations, delay in response, and self corrections. When these words were first introduced, he only was able to label 2/26 words. Pt was extremely pleased to hear about his progress. Reading comprehension of short paragraph was targeted with multiple choice response and he completed with 88% acc with mi/mod cues. He self corrected paraphasic errors in conversation about 75% of the time. Mr. Vilardi continues to make excellent progress. Continue with POC.    Speech Therapy Frequency 3x / week   Duration --  2 months   Treatment/Interventions Cueing hierarchy;Multimodal communcation approach;SLP instruction and feedback;Compensatory strategies;Patient/family education   Potential to Achieve Goals Good   Potential Considerations Severity of impairments   SLP Home Exercise Plan Pt will complete HEP as assigned to facilitate carryover of treatment strategies and techniques in home/community environment   Consulted and Agree with Plan of Care Patient        Problem List Patient Active Problem List   Diagnosis Date Noted  . Hypertensive  emergency 05/20/2015  . Hyperlipidemia 05/20/2015  . Depression 05/20/2015  . 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 [place], [place], [address] Phone: [phone]   Fax:  [phone]   Name: Dmitriy Gair MRN: [id number] Date of Birth: 03-08-1961

## 2015-06-27 ENCOUNTER — Encounter (HOSPITAL_COMMUNITY): Payer: BLUE CROSS/BLUE SHIELD | Admitting: Speech Pathology

## 2015-07-01 ENCOUNTER — Ambulatory Visit (HOSPITAL_COMMUNITY): Payer: BLUE CROSS/BLUE SHIELD | Attending: Neurology | Admitting: Speech Pathology

## 2015-07-01 DIAGNOSIS — R4701 Aphasia: Secondary | ICD-10-CM | POA: Insufficient documentation

## 2015-07-01 DIAGNOSIS — I69920 Aphasia following unspecified cerebrovascular disease: Secondary | ICD-10-CM | POA: Diagnosis present

## 2015-07-01 NOTE — Therapy (Signed)
[facility] [address] [place], [place], [address] Phone: [phone]   Fax:  [phone]  Speech Language Pathology Treatment  Patient Details  Name: Dakota [name] MRN: [id number] Date of Birth: Aug 10, 1960 Referring Provider: Delia Heady  Encounter Date: 07/01/2015      End of Session - 07/01/15 1830    Visit Number 11   Number of Visits 24   Date for SLP Re-Evaluation 07/25/15   Authorization Type BCBS   SLP Start Time 1351   SLP Stop Time  1438   SLP Time Calculation (min) 47 min   Activity Tolerance Patient tolerated treatment well      Past Medical History  Diagnosis Date  . Hypertension   . Depression   . Stroke [facility])     No past surgical history on file.  There were no vitals filed for this visit.  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 No/denies               ADULT SLP TREATMENT - 07/01/15 1728    General Information   Behavior/Cognition Alert;Cooperative;Pleasant mood   Patient Positioning Upright in chair   Oral care provided N/A   HPI [name] presented to the ED on 05/16/15 s/p severe headache and confusion for 3 days. A CT of his head detected a intracranial hemmorhage of L parietal temporal lobe. He was discharged home on 05/21/15 and has been referred to speech therapy for evaluation and treatment.    Treatment Provided   Treatment provided Cognitive-Linquistic   Pain Assessment   Pain Assessment No/denies pain   Cognitive-Linquistic Treatment   Treatment focused on Aphasia;Patient/family/caregiver education   Skilled Treatment auditory comprehension strategies with use of written cues, multimodality communication  strategies, caregiver education, word finding strategies, awareness   Assessment / Recommendations / Plan   Plan Continue with current plan of care   Progression Toward Goals   Progression toward goals Progressing toward goals            SLP Short Term Goals - 07/01/15 2136    SLP SHORT TERM GOAL #1   Title Pt will increase naming of common objects/pictures to 70% acc when provided with mod/max multimodality cues    Baseline 0%   Time 8   Period Weeks   Status On-going   SLP SHORT TERM GOAL #2   Title Pt will complete single word sentence completion tasks (SWSC) with 70% acc when provided with mod/max multimodality cues.    Baseline 25%   Time 8   Period Weeks   Status On-going   SLP SHORT TERM GOAL #3   Title Pt will verbalize automatic speech tasks (counting, days, months, songs, etc) with 80% acc when provided mod/max multimodality cues    Baseline 25%   Time 8   Period Weeks   Status On-going   SLP SHORT TERM GOAL #4   Title Pt will complete basic level reading comprehension tasks (picture cues for sentence level) with 70% acc with provided mod cues.   Baseline 30%   Time 8   Period Weeks   Status On-going   SLP SHORT TERM GOAL #5   Title Pt will follow 1-step verbal  commands with 80% acc when provided mod cues from SLP    Baseline 60%   Time 8   Period Weeks   Status On-going          SLP Long Term Goals - 07/01/15 2136    SLP LONG TERM GOAL #1   Title Pt will communicate moderate level wants/needs/thoughts to family and friends with use of multimodality communication strategies as needed.    Baseline max assist   Time 4   Period Months   Status On-going          Plan - 07/01/15 1831    Clinical Impression Statement [name] was accompanied by his fiance, [name]. He reported that he worked on his speech homework over the weekend. He continues to express feelings of depression, but acknowleges that he feels better after coming to therapy. He  verbalized that his friends/family keep telling him they are amazed at his progress, but it is difficult for him to see. SLP facilitated session by providing min to min/mod assist during conversation speech through use of written cues to supplement both receptive and expressive language. He his expressing himself with greater fluency with continued paraphasic errors, but errors are more intelligible and create slight approximations of words. He struggles with with auditory comprehension when topics are changed. Will continue to address.    Speech Therapy Frequency 3x / week   Duration --  2 months   Treatment/Interventions Cueing hierarchy;Multimodal communcation approach;SLP instruction and feedback;Compensatory strategies;Patient/family education   Potential to Achieve Goals Good   Potential Considerations Severity of impairments   SLP Home Exercise Plan Pt will complete HEP as assigned to facilitate carryover of treatment strategies and techniques in home/community environment   Consulted and Agree with Plan of Care Patient        Problem List Patient Active Problem List   Diagnosis Date Noted  . Hypertensive emergency 05/20/2015  . Hyperlipidemia 05/20/2015  . Depression 05/20/2015  . 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

## 2015-07-03 ENCOUNTER — Ambulatory Visit (HOSPITAL_COMMUNITY): Payer: BLUE CROSS/BLUE SHIELD | Admitting: Speech Pathology

## 2015-07-03 ENCOUNTER — Encounter (HOSPITAL_COMMUNITY): Payer: Self-pay | Admitting: Speech Pathology

## 2015-07-03 DIAGNOSIS — I69920 Aphasia following unspecified cerebrovascular disease: Secondary | ICD-10-CM | POA: Diagnosis not present

## 2015-07-03 NOTE — Therapy (Signed)
[place] [facility] [address] [place], [place], [address] Phone: [phone]   Fax:  [phone]  Speech Language Pathology Treatment  Patient Details  Name: Dakota [name] MRN: [id number] Date of Birth: 21-Jan-1961 Referring Provider: Delia Heady  Encounter Date: 07/03/2015      End of Session - 07/03/15 1425    Visit Number 12   Number of Visits 24   Date for SLP Re-Evaluation 07/25/15   Authorization Type BCBS   SLP Start Time 1100   SLP Stop Time  1152   SLP Time Calculation (min) 52 min   Activity Tolerance Patient tolerated treatment well      Past Medical History  Diagnosis Date  . Hypertension   . Depression   . Stroke [facility])     History reviewed. No pertinent past surgical history.  There were no vitals filed for this visit.  Visit Diagnosis: Aphasia following cerebrovascular disease      Subjective Assessment - 07/03/15 1424    Subjective "I dressed up today."   Patient is accompained by: Family member   Special Tests Portions of Western Aphasia Battery and other informal aphasia measures   Currently in Pain? No/denies          ADULT SLP TREATMENT - 07/03/15 1424    General Information   Behavior/Cognition Alert;Cooperative;Pleasant mood   Patient Positioning Upright in chair   Oral care provided N/A   HPI Dakota [name] presented to the ED on 05/16/15 s/p severe headache and confusion for 3 days. A CT of his head detected a intracranial hemmorhage of L parietal temporal lobe. He was discharged home on 05/21/15 and has been referred to speech therapy for evaluation and treatment.    Treatment Provided   Treatment provided Cognitive-Linquistic   Pain Assessment   Pain Assessment No/denies pain   Cognitive-Linquistic Treatment   Treatment focused on Aphasia;Patient/family/caregiver education   Skilled Treatment auditory comprehension strategies with use of written cues, multimodality communication  strategies, caregiver education, word finding strategies, awareness   Assessment / Recommendations / Plan   Plan Continue with current plan of care   Progression Toward Goals   Progression toward goals Progressing toward goals            SLP Short Term Goals - 07/03/15 1426    SLP SHORT TERM GOAL #1   Title Pt will increase naming of common objects/pictures to 70% acc when provided with mod/max multimodality cues    Baseline 0%   Time 8   Period Weeks   Status On-going   SLP SHORT TERM GOAL #2   Title Pt will complete single word sentence completion tasks (SWSC) with 70% acc when provided with mod/max multimodality cues.    Baseline 25%   Time 8   Period Weeks   Status On-going   SLP SHORT TERM GOAL #3   Title Pt will verbalize automatic speech tasks (counting, days, months, songs, etc) with 80% acc when provided mod/max multimodality cues    Baseline 25%   Time 8   Period Weeks   Status On-going   SLP SHORT TERM GOAL #4   Title Pt will complete basic level reading comprehension tasks (picture cues for sentence level) with 70% acc with provided mod cues.   Baseline 30%   Time 8   Period Weeks   Status On-going   SLP SHORT TERM GOAL #5   Title Pt will follow 1-step verbal commands with 80% acc when provided mod cues from  SLP    Baseline 60%   Time 8   Period Weeks   Status On-going          SLP Long Term Goals - 07/03/15 1426    SLP LONG TERM GOAL #1   Title Pt will communicate moderate level wants/needs/thoughts to family and friends with use of multimodality communication strategies as needed.    Baseline max assist   Time 4   Period Months   Status On-going          Plan - 07/03/15 1425    Clinical Impression Statement Dakota [name] was accompanied by his fiance and brother today. He continues to make excellent progress. He was very talkative and fluent today and he acknowledges that it is because he does "better in the morning" to which Babette Relicammy and  Tinnie GensJeffrey agree. SLP facilitated conversation by providing only minimal cues for error awareness and allowed pt to make self corrections as needed. Confrontation naming was again targeted with previous group of 26 words. He named 17/26 without delay and with complete accuracy; He named 26/26 when allowed for self correction and approximation and only one cue. Dakota [name] continues to be motivated and expresses that he wants to make a complete recovery regarding his speech. Continue POC. HEP assigned.   Speech Therapy Frequency 3x / week   Duration --  2 months   Treatment/Interventions Cueing hierarchy;Multimodal communcation approach;SLP instruction and feedback;Compensatory strategies;Patient/family education   Potential to Achieve Goals Good   Potential Considerations Severity of impairments   SLP Home Exercise Plan Pt will complete HEP as assigned to facilitate carryover of treatment strategies and techniques in home/community environment   Consulted and Agree with Plan of Care Patient        Problem List Patient Active Problem List   Diagnosis Date Noted  . Hypertensive emergency 05/20/2015  . Hyperlipidemia 05/20/2015  . Depression 05/20/2015  . 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

## 2015-07-04 ENCOUNTER — Ambulatory Visit (HOSPITAL_COMMUNITY): Payer: BLUE CROSS/BLUE SHIELD | Admitting: Speech Pathology

## 2015-07-04 DIAGNOSIS — IMO0002 Reserved for concepts with insufficient information to code with codable children: Secondary | ICD-10-CM

## 2015-07-04 DIAGNOSIS — I69920 Aphasia following unspecified cerebrovascular disease: Secondary | ICD-10-CM | POA: Diagnosis not present

## 2015-07-04 NOTE — Therapy (Signed)
[place] [facility] [address] [place], [place], [address] Phone: [phone]   Fax:  [phone]  Speech Language Pathology Treatment  Patient Details  Name: Dakota [name] MRN: [id number] Date of Birth: 02-10-1961 Referring Provider: Delia HeadyPramod Sethi  Encounter Date: 07/04/2015      End of Session - 07/04/15 1750    Visit Number 13   Number of Visits 24   Date for SLP Re-Evaluation 07/25/15   Authorization Type BCBS   SLP Start Time 1605   SLP Stop Time  1705   SLP Time Calculation (min) 60 min   Activity Tolerance Patient tolerated treatment well      Past Medical History  Diagnosis Date  . Hypertension   . Depression   . Stroke [facility](HCC)     No past surgical history on file.  There were no vitals filed for this visit.  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 No/denies               ADULT SLP TREATMENT - 07/04/15 1743    General Information   Behavior/Cognition Alert;Cooperative;Pleasant mood   Patient Positioning Upright in chair   Oral care provided N/A   HPI [name] presented to the ED on 05/16/15 s/p severe headache and confusion for 3 days. A CT of his head detected a intracranial hemmorhage of L parietal temporal lobe. He was discharged home on 05/21/15 and has been referred to speech therapy for evaluation and treatment.    Treatment Provided   Treatment provided Cognitive-Linquistic   Pain Assessment   Pain Assessment No/denies pain   Cognitive-Linquistic Treatment   Treatment focused on Aphasia;Patient/family/caregiver education   Skilled Treatment auditory comprehension strategies with use of written cues, multimodality  communication strategies, caregiver education, word finding strategies, awareness   Assessment / Recommendations / Plan   Plan Continue with current plan of care   Progression Toward Goals   Progression toward goals Progressing toward goals            SLP Short Term Goals - 07/04/15 1756    SLP SHORT TERM GOAL #1   Title Pt will increase naming of common objects/pictures to 70% acc when provided with mod/max multimodality cues    Baseline 0%   Time 8   Period Weeks   Status On-going   SLP SHORT TERM GOAL #2   Title Pt will complete single word sentence completion tasks (SWSC) with 70% acc when provided with mod/max multimodality cues.    Baseline 25%   Time 8   Period Weeks   Status On-going   SLP SHORT TERM GOAL #3   Title Pt will verbalize automatic speech tasks (counting, days, months, songs, etc) with 80% acc when provided mod/max multimodality cues    Baseline 25%   Time 8   Period Weeks   Status On-going   SLP SHORT TERM GOAL #4   Title Pt will complete basic level reading comprehension tasks (picture cues for sentence level) with 70% acc with provided mod cues.   Baseline 30%   Time 8   Period Weeks   Status On-going   SLP SHORT TERM GOAL #5   Title Pt will follow  1-step verbal commands with 80% acc when provided mod cues from SLP    Baseline 60%   Time 8   Period Weeks   Status On-going          SLP Long Term Goals - 07/04/15 1758    SLP LONG TERM GOAL #1   Title Pt will communicate moderate level wants/needs/thoughts to family and friends with use of multimodality communication strategies as needed.    Baseline max assist   Time 4   Period Months   Status On-going          Plan - 07/04/15 1751    Clinical Impression Statement Dakota [name] was accompanied by his fiance today. He was in good spirits. SLP reviewed the progress he's made toward all goals. Session focused on auditory comprehension of single sentences which required that he complete  sentence with a single word. Dakota [name] had great difficulty with auditory comprehension without use of written cues. He often gave a related, but incorrect response. Many of the single word sentence completion tasks were automatic phrases and common idioms. He seemed to translate the idioms literally and little recognition for the expression once explained by SLP ("stop and smell the _____"). He benefited being able to read the sentences and SLP provided first letter cue for acceptable/expected response. Conversational speech continues to increase in fluency, however not quite as fluent as yesterday (seen in the AM). Mood is good and frustration has decreased. Plan to update all goals next week.   Speech Therapy Frequency 3x / week   Duration --  2 months   Treatment/Interventions Cueing hierarchy;Multimodal communcation approach;SLP instruction and feedback;Compensatory strategies;Patient/family education   Potential to Achieve Goals Good   Potential Considerations Severity of impairments   SLP Home Exercise Plan Pt will complete HEP as assigned to facilitate carryover of treatment strategies and techniques in home/community environment   Consulted and Agree with Plan of Care Patient        Problem List Patient Active Problem List   Diagnosis Date Noted  . Hypertensive emergency 05/20/2015  . Hyperlipidemia 05/20/2015  . Depression 05/20/2015  . 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

## 2015-07-08 ENCOUNTER — Ambulatory Visit (HOSPITAL_COMMUNITY): Payer: BLUE CROSS/BLUE SHIELD | Admitting: Speech Pathology

## 2015-07-08 DIAGNOSIS — I69920 Aphasia following unspecified cerebrovascular disease: Secondary | ICD-10-CM | POA: Diagnosis not present

## 2015-07-08 DIAGNOSIS — R4701 Aphasia: Secondary | ICD-10-CM

## 2015-07-08 NOTE — Therapy (Signed)
[place] [name] [address] [place], [place], [address] Phone: [phone]   Fax:  [phone]  Speech Language Pathology Treatment  Patient Details  Name: Dakota [name] MRN: [id number] Date of Birth: 1960-10-30 Referring Provider: Delia Heady  Encounter Date: 07/08/2015      End of Session - 07/08/15 1633    Visit Number 14   Number of Visits 24   Date for SLP Re-Evaluation 07/25/15   Authorization Type BCBS   SLP Start Time 1353   SLP Stop Time  1440   SLP Time Calculation (min) 47 min   Activity Tolerance Patient tolerated treatment well      Past Medical History  Diagnosis Date  . Hypertension   . Depression   . Stroke [name])     No past surgical history on file.  There were no vitals filed for this visit.      Subjective Assessment - 07/08/15 1632    Subjective "It is so good to see you!"   Patient is accompained by: Family member   Special Tests Portions of Western Aphasia Battery and other informal aphasia measures               ADULT SLP TREATMENT - 07/08/15 1633    General Information   Behavior/Cognition Alert;Cooperative;Pleasant mood   Patient Positioning Upright in chair   Oral care provided N/A   HPI Dakota [name] presented to the ED on 05/16/15 s/p severe headache and confusion for 3 days. A CT of his head detected a intracranial hemmorhage of L parietal temporal lobe. He was discharged home on 05/21/15 and has been referred to speech therapy for evaluation and treatment.    Treatment Provided   Treatment provided Cognitive-Linquistic   Pain Assessment   Pain Assessment No/denies pain   Cognitive-Linquistic Treatment   Treatment focused on Aphasia;Patient/family/caregiver education   Skilled Treatment auditory comprehension strategies with use of written cues, multimodality communication strategies, caregiver education, word finding strategies, awareness   Assessment / Recommendations /  Plan   Plan Continue with current plan of care   Progression Toward Goals   Progression toward goals Progressing toward goals            SLP Short Term Goals - 07/08/15 1634    SLP SHORT TERM GOAL #1   Title Pt will increase naming of common objects/pictures to 70% acc when provided with mod/max multimodality cues    Baseline 0%   Time 8   Period Weeks   Status On-going   SLP SHORT TERM GOAL #2   Title Pt will complete single word sentence completion tasks (SWSC) with 70% acc when provided with mod/max multimodality cues.    Baseline 25%   Time 8   Period Weeks   Status On-going   SLP SHORT TERM GOAL #3   Title Pt will verbalize automatic speech tasks (counting, days, months, songs, etc) with 80% acc when provided mod/max multimodality cues    Baseline 25%   Time 8   Period Weeks   Status On-going   SLP SHORT TERM GOAL #4   Title Pt will complete basic level reading comprehension tasks (picture cues for sentence level) with 70% acc with provided mod cues.   Baseline 30%   Time 8   Period Weeks   Status On-going   SLP SHORT TERM GOAL #5   Title Pt will follow 1-step verbal commands with 80% acc when provided mod cues from SLP    Baseline 60%  Time 8   Period Weeks   Status On-going          SLP Long Term Goals - 07/08/15 1634    SLP LONG TERM GOAL #1   Title Pt will communicate moderate level wants/needs/thoughts to family and friends with use of multimodality communication strategies as needed.    Baseline max assist   Time 4   Period Months   Status On-going          Plan - 07/08/15 1634    Clinical Impression Statement Mr. Stasia [name] was accompanied by his fiance and brother today. He reported that he had a good weekend and spent some time with old friends. He reports feeling positive about seeing friends, but does feel drained after. Pt with improved mood and fluency. SLP presented with additional reading comprehension tasks this date which required him  to select words belonging to a specific category. He initially required mod SLP cues which faded to min cues once he grasped the task for overall 90% acc. Pt using compensatory strategies for word finding (writing the word, referring back to written notes) with indirect cues this session. He continues to make excellent progress toward all goals. Continue to target reading and auditory comprehension while addressing expressive deficits. Pt continues to be highly motivated.    Speech Therapy Frequency 3x / week   Duration --  2 months   Treatment/Interventions Cueing hierarchy;Multimodal communcation approach;SLP instruction and feedback;Compensatory strategies;Patient/family education   Potential to Achieve Goals Good   Potential Considerations Severity of impairments   SLP Home Exercise Plan Pt will complete HEP as assigned to facilitate carryover of treatment strategies and techniques in home/community environment   Consulted and Agree with Plan of Care Patient      Patient will benefit from skilled therapeutic intervention in order to improve the following deficits and impairments:   Aphasia    Problem List Patient Active Problem List   Diagnosis Date Noted  . Hypertensive emergency 05/20/2015  . Hyperlipidemia 05/20/2015  . Depression 05/20/2015  . 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

## 2015-07-10 ENCOUNTER — Encounter (HOSPITAL_COMMUNITY): Payer: Self-pay | Admitting: Speech Pathology

## 2015-07-10 ENCOUNTER — Ambulatory Visit (HOSPITAL_COMMUNITY): Payer: BLUE CROSS/BLUE SHIELD | Admitting: Speech Pathology

## 2015-07-10 DIAGNOSIS — R4701 Aphasia: Secondary | ICD-10-CM

## 2015-07-10 DIAGNOSIS — I69920 Aphasia following unspecified cerebrovascular disease: Secondary | ICD-10-CM | POA: Diagnosis not present

## 2015-07-10 NOTE — Therapy (Signed)
[place] [facility] [address] [place], [place], [address] Phone: [phone]   Fax:  [phone]  Speech Language Pathology Treatment/Progress Report  Patient Details  Name: Dakota [name] MRN: [id number] Date of Birth: 13-Jul-1960 Referring Provider: Delia HeadyPramod Sethi  Encounter Date: 07/10/2015      End of Session - 07/10/15 1240    Visit Number 15   Number of Visits 24   Date for SLP Re-Evaluation 07/25/15   Authorization Type BCBS   SLP Start Time 1115   SLP Stop Time  1215   SLP Time Calculation (min) 60 min   Activity Tolerance Patient tolerated treatment well      Past Medical History  Diagnosis Date  . Hypertension   . Depression   . Stroke [facility](HCC)     History reviewed. No pertinent past surgical history.  There were no vitals filed for this visit.      Subjective Assessment - 07/10/15 1239    Subjective "I am so excited to see you!"   Patient is accompained by: Family member   Special Tests Portions of Western Aphasia Battery and other informal aphasia measures   Currently in Pain? No/denies               ADULT SLP TREATMENT - 07/10/15 1239    General Information   Behavior/Cognition Alert;Cooperative;Pleasant mood   Patient Positioning Upright in chair   Oral care provided N/A   HPI Dakota [name] presented to the ED on 05/16/15 s/p severe headache and confusion for 3 days. A CT of his head detected a intracranial hemmorhage of L parietal temporal lobe. He was discharged home on 05/21/15 and has been referred to speech therapy for evaluation and treatment.    Treatment Provided   Treatment provided Cognitive-Linquistic   Pain Assessment   Pain Assessment No/denies pain   Cognitive-Linquistic Treatment   Treatment focused on Aphasia;Patient/family/caregiver education   Skilled Treatment auditory comprehension strategies with use of written cues, multimodality communication strategies, caregiver education,  word finding strategies, awareness   Assessment / Recommendations / Plan   Plan Continue with current plan of care   Progression Toward Goals   Progression toward goals Progressing toward goals            SLP Short Term Goals - 07/10/15 1251    SLP SHORT TERM GOAL #1   Title Pt will increase naming of common objects/pictures to 70% acc when provided with mod/max multimodality cues   change to 90% with min multimodality cues.    Baseline 0%   Time 8   Period Weeks   Status Achieved   SLP SHORT TERM GOAL #2   Title Pt will complete single word sentence completion tasks (SWSC) with 70% acc when provided with mod/max multimodality cues.    Baseline 25%  Change to 90% with min multimodality cues   Time 8   Period Weeks   Status Achieved   SLP SHORT TERM GOAL #3   Title Pt will verbalize automatic speech tasks (counting, days, months, songs, etc) with 80% acc when provided mod/max multimodality cues    Baseline 25%   Time 8   Period Weeks   Status Achieved   SLP SHORT TERM GOAL #4   Title Pt will complete basic level reading comprehension tasks (picture cues for sentence level) with 70% acc with provided mod cues.  Change to sentence-length with 90% acc with min cues   Baseline 30%   Time 8   Period  Weeks   Status Achieved   SLP SHORT TERM GOAL #5   Title Pt will follow 1-step verbal commands with 80% acc when provided mod cues from SLP    Baseline 60%   Time 8   Period Weeks   Status Achieved   Additional Short Term Goals   Additional Short Term Goals Yes   SLP SHORT TERM GOAL #6   Title Pt will demonstrate increased auditory comprehension for moderate-level yes/no questions to 90% with min multimodality cues as needed.   Baseline 75% mod assist   Time 8   Period Weeks   Status New   SLP SHORT TERM GOAL #7   Title Pt will complete moderate-level mixed word finding/naming tasks with 90% acc with mi/mod assist.   Baseline Varies from 70-85% with mod assist   Time 8    Period Weeks   Status New          SLP Long Term Goals - 07/10/15 1257    SLP LONG TERM GOAL #1   Title Pt will communicate moderate level wants/needs/thoughts to family and friends with use of multimodality communication strategies as needed.    Baseline max assist   Time 4   Period Months   Status On-going          Plan - 07/10/15 1241    Clinical Impression Statement Dakota [name] was accompanied by his brother, Dakota [name] today. He was able to verbalize that his fiance's daughter was up at the hospital with the flu and was able to tell me her name. Pt wanted to review homework with me and was very proud of his hard work. He orally read sentence-length material with 93% acc with min assist for corrections. Pt independtly using strategies to help with visual scanning/word placement. He completed single word sentence completion tasks with 84% acc with mild cues from SLP for appropriate word choice. Errors continue to be related (cost/money, time/o'clock) and pt needs moderate cueing from SLP to understand why it doesn't work grammatically. Dakota [name] is incredibly motivated and determined. He has made outstanding progress toward all short term goals. Goals to be updated and continued skilled SLP services recommended to increase independence with expressive and receptive language. Pt with excellent judgment and memory skills; family continues to be supportive.   Speech Therapy Frequency 3x / week   Duration --  2 months   Treatment/Interventions Cueing hierarchy;Multimodal communcation approach;SLP instruction and feedback;Compensatory strategies;Patient/family education   Potential to Achieve Goals Good   Potential Considerations Severity of impairments   SLP Home Exercise Plan Pt will complete HEP as assigned to facilitate carryover of treatment strategies and techniques in home/community environment   Consulted and Agree with Plan of Care Patient      Patient will benefit from  skilled therapeutic intervention in order to improve the following deficits and impairments:   Aphasia - Plan: SLP plan of care cert/re-cert    Problem List Patient Active Problem List   Diagnosis Date Noted  . Hypertensive emergency 05/20/2015  . Hyperlipidemia 05/20/2015  . Depression 05/20/2015  . 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

## 2015-07-11 ENCOUNTER — Encounter (HOSPITAL_COMMUNITY): Payer: BLUE CROSS/BLUE SHIELD | Admitting: Speech Pathology

## 2015-07-15 ENCOUNTER — Encounter (HOSPITAL_COMMUNITY): Payer: BLUE CROSS/BLUE SHIELD | Admitting: Speech Pathology

## 2015-07-16 ENCOUNTER — Encounter (HOSPITAL_COMMUNITY): Payer: BLUE CROSS/BLUE SHIELD | Admitting: Speech Pathology

## 2015-07-17 ENCOUNTER — Ambulatory Visit (HOSPITAL_COMMUNITY): Payer: BLUE CROSS/BLUE SHIELD | Admitting: Speech Pathology

## 2015-07-17 ENCOUNTER — Encounter (HOSPITAL_COMMUNITY): Payer: Self-pay | Admitting: Speech Pathology

## 2015-07-17 DIAGNOSIS — R4701 Aphasia: Secondary | ICD-10-CM

## 2015-07-17 DIAGNOSIS — I69920 Aphasia following unspecified cerebrovascular disease: Secondary | ICD-10-CM | POA: Diagnosis not present

## 2015-07-17 NOTE — Therapy (Signed)
[facility] [address] [place], [place], [address] Phone: [phone]   Fax:  [phone]  Speech Language Pathology Treatment  Patient Details  Name: Dakota [name] MRN: [id number] Date of Birth: [birth year] Referring Provider: Delia Heady  Encounter Date: 07/17/2015      End of Session - 07/17/15 1338    Visit Number 16   Number of Visits 32   Date for SLP Re-Evaluation 08/29/15   Authorization Type BC[employer]   SLP Start Time 1030   SLP Stop Time  1120   SLP Time Calculation (min) 50 min   Activity Tolerance Patient tolerated treatment well      Past Medical History  Diagnosis Date  . Hypertension   . Depression   . Stroke [facility])     History reviewed. No pertinent past surgical history.  There were no vitals filed for this visit.      Subjective Assessment - 07/17/15 1327    Subjective "Hey there!"   Patient is accompained by: Family member   Special Tests Portions of Western Aphasia Battery and other informal aphasia measures   Currently in Pain? No/denies               ADULT SLP TREATMENT - 07/17/15 1328    General Information   Behavior/Cognition Alert;Cooperative;Pleasant mood   Patient Positioning Upright in chair   Oral care provided N/A   HPI Dakota [name] presented to the ED on 05/16/15 s/p severe headache and confusion for 3 days. A CT of his head detected a intracranial hemmorhage of L parietal temporal lobe. He was discharged home on 05/21/15 and has been referred to speech therapy for evaluation and treatment.    Treatment Provided   Treatment provided Cognitive-Linquistic   Pain Assessment   Pain Assessment No/denies pain   Cognitive-Linquistic Treatment   Treatment focused on Aphasia;Patient/family/caregiver education   Skilled Treatment auditory comprehension strategies with use of written cues, multimodality communication strategies, caregiver education, word finding strategies, awareness    Assessment / Recommendations / Plan   Plan Continue with current plan of care   Progression Toward Goals   Progression toward goals Progressing toward goals            SLP Short Term Goals - 07/17/15 1349    SLP SHORT TERM GOAL #1   Title Pt will increase naming of common objects/pictures to 90% acc when provided with min multimodality cues   change to 90% with min multimodality cues.    Baseline 0%   Time 8   Period Weeks   Status On-going   SLP SHORT TERM GOAL #2   Title Pt will complete single word sentence completion tasks [facility]) with 90% acc when provided with min multimodality cues.    Baseline 25%  Change to 90% with min multimodality cues   Time 8   Period Weeks   Status On-going   SLP SHORT TERM GOAL #3   Title Pt will verbalize automatic speech tasks (counting, days, months, songs, etc) with 80% acc when provided mod/max multimodality cues    Baseline 25%   Time 8   Period Weeks   Status Achieved   SLP SHORT TERM GOAL #4   Title Pt will complete sentence-level reading comprehension tasks with 90% acc when provided min cues.  Change to sentence-length with 90% acc with min cues   Baseline 30%   Time 8   Period Weeks   Status On-going   SLP SHORT TERM GOAL #5  Title Pt will follow 1-step verbal commands with 80% acc when provided mod cues from SLP    Baseline 60%   Time 8   Period Weeks   Status Achieved   SLP SHORT TERM GOAL #6   Title Pt will demonstrate increased auditory comprehension for moderate-level yes/no questions to 90% with min multimodality cues as needed.   Baseline 75% mod assist   Time 8   Period Weeks   Status On-going   SLP SHORT TERM GOAL #7   Title Pt will complete moderate-level mixed word finding/naming tasks with 90% acc with mi/mod assist.   Baseline Varies from 70-85% with mod assist   Time 8   Period Weeks   Status On-going          SLP Long Term Goals - 07/17/15 1351    SLP LONG TERM GOAL #1   Title Pt will  communicate moderate level wants/needs/thoughts to family and friends with use of multimodality communication strategies as needed.    Baseline max assist   Time 4   Period Months   Status On-going          Plan - 07/17/15 1339    Clinical Impression Statement Dakota [name] was accompanied by his fiance, [name] today. He reports practicing a lot over the weekend on his homework and even got a headache from doing it for too long. SLP reiterated need to schedule in rest breaks and pursue activities that are not language loaded at times so as not to over do it. He completed the reading comprehension exercise regarding understanding of idioms ("it's raining cats and dogs") for homework with 75% acc. He was able to read the sentences aloud with min assist for occasional pronunciation of words (dishonest was read as "dish" "onest"). He reported that he did not complete the single word to picture crossword puzzle because it "was too easy" so SLP removed the word bank and had him complete, which proved more challenging. He labeled pictures with 80% acc independently and required mod assist for the remaining. In conversation, Dakota [name] continues to implement word selection strategies with need for only occasional reminders. Today, he demonstrated how he has been using his smart phone to type in text for [employer] assist. SLP showed him how to use the microphone feature and he was able to record a question ("What is our country's bird?") with written cues and rehearsal assist to locate the answer he sought. Additional homework presented which should be more challenging. He continues to make outstanding progress and his personal outlook continues to be optimistic. Continue next session with focus on auditory comprehension of complex yes/no questions.    Speech Therapy Frequency 3x / week   Duration --  2 months   Treatment/Interventions Cueing hierarchy;Multimodal communcation approach;SLP instruction and  feedback;Compensatory strategies;Patient/family education   Potential to Achieve Goals Good   Potential Considerations Severity of impairments   SLP Home Exercise Plan Pt will complete HEP as assigned to facilitate carryover of treatment strategies and techniques in home/community environment   Consulted and Agree with Plan of Care Patient      Patient will benefit from skilled therapeutic intervention in order to improve the following deficits and impairments:   Aphasia    Problem List Patient Active Problem List   Diagnosis Date Noted  . Hypertensive emergency 05/20/2015  . Hyperlipidemia 05/20/2015  . Depression 05/20/2015  . 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

## 2015-07-18 ENCOUNTER — Telehealth (HOSPITAL_COMMUNITY): Payer: Self-pay | Admitting: Speech Pathology

## 2015-07-18 ENCOUNTER — Ambulatory Visit (HOSPITAL_COMMUNITY): Payer: BLUE CROSS/BLUE SHIELD | Admitting: Speech Pathology

## 2015-07-18 NOTE — Telephone Encounter (Signed)
Speech Pathology  Pt did not show for appointment, which is unlike him. SLP left voicemail on [name]'s cell phone.  Thank you,  Havery MorosDabney Jasalyn Frysinger, CCC-SLP [phone]

## 2015-07-22 ENCOUNTER — Encounter: Payer: Self-pay | Admitting: Neurology

## 2015-07-22 ENCOUNTER — Ambulatory Visit (INDEPENDENT_AMBULATORY_CARE_PROVIDER_SITE_OTHER): Payer: BLUE CROSS/BLUE SHIELD | Admitting: Neurology

## 2015-07-22 VITALS — BP 127/83 | HR 104 | Ht 69.0 in | Wt 209.0 lb

## 2015-07-22 DIAGNOSIS — I610 Nontraumatic intracerebral hemorrhage in hemisphere, subcortical: Secondary | ICD-10-CM

## 2015-07-22 NOTE — Patient Instructions (Signed)
I had a long d/w patient and his fiancee about his recent left temporal brain hemorrhage risk for recurrent stroke/TIAs, personally independently reviewed imaging studies and stroke evaluation results and answered questions.Continue   strict control of hypertension with blood pressure goal below 130/90, diabetes with hemoglobin A1c goal below 6.5% and lipids with LDL cholesterol goal below 70 mg/dL. I also advised the patient to eat a healthy diet with plenty of whole grains, cereals, fruits and vegetables, exercise regularly and maintain ideal body weight .Continue ongoing outpatient speech therapy for his aphasia..check follow-up CT angiogram of the brain to look for small vascular lesion not seen on initial imaging Followup in the future with stroke nurse practitioner in 3 months or call earlier if necessary. Stroke Prevention Some medical conditions and behaviors are associated with an increased chance of having a stroke. You may prevent a stroke by making healthy choices and managing medical conditions. HOW CAN I REDUCE MY RISK OF HAVING A STROKE?   Stay physically active. Get at least 30 minutes of activity on most or all days.  Do not smoke. It may also be helpful to avoid exposure to secondhand smoke.  Limit alcohol use. Moderate alcohol use is considered to be:  No more than 2 drinks per day for men.  No more than 1 drink per day for nonpregnant women.  Eat healthy foods. This involves:  Eating 5 or more servings of fruits and vegetables a day.  Making dietary changes that address high blood pressure (hypertension), high cholesterol, diabetes, or obesity.  Manage your cholesterol levels.  Making food choices that are high in fiber and low in saturated fat, trans fat, and cholesterol may control cholesterol levels.  Take any prescribed medicines to control cholesterol as directed by your health care provider.  Manage your diabetes.  Controlling your carbohydrate and sugar intake  is recommended to manage diabetes.  Take any prescribed medicines to control diabetes as directed by your health care provider.  Control your hypertension.  Making food choices that are low in salt (sodium), saturated fat, trans fat, and cholesterol is recommended to manage hypertension.  Ask your health care provider if you need treatment to lower your blood pressure. Take any prescribed medicines to control hypertension as directed by your health care provider.  If you are 28-36 years of age, have your blood pressure checked every 3-5 years. If you are 27 years of age or older, have your blood pressure checked every year.  Maintain a healthy weight.  Reducing calorie intake and making food choices that are low in sodium, saturated fat, trans fat, and cholesterol are recommended to manage weight.  Stop drug abuse.  Avoid taking birth control pills.  Talk to your health care provider about the risks of taking birth control pills if you are over 36 years old, smoke, get migraines, or have ever had a blood clot.  Get evaluated for sleep disorders (sleep apnea).  Talk to your health care provider about getting a sleep evaluation if you snore a lot or have excessive sleepiness.  Take medicines only as directed by your health care provider.  For some people, aspirin or blood thinners (anticoagulants) are helpful in reducing the risk of forming abnormal blood clots that can lead to stroke. If you have the irregular heart rhythm of atrial fibrillation, you should be on a blood thinner unless there is a good reason you cannot take them.  Understand all your medicine instructions.  Make sure that other conditions (such  as anemia or atherosclerosis) are addressed. SEEK IMMEDIATE MEDICAL CARE IF:   You have sudden weakness or numbness of the face, arm, or leg, especially on one side of the body.  Your face or eyelid droops to one side.  You have sudden confusion.  You have trouble  speaking (aphasia) or understanding.  You have sudden trouble seeing in one or both eyes.  You have sudden trouble walking.  You have dizziness.  You have a loss of balance or coordination.  You have a sudden, severe headache with no known cause.  You have new chest pain or an irregular heartbeat. Any of these symptoms may represent a serious problem that is an emergency. Do not wait to see if the symptoms will go away. Get medical help at once. Call your local emergency services (911 in [place] Do not drive yourself to the hospital.   This information is not intended to replace advice given to you by your health care provider. Make sure you discuss any questions you have with your health care provider.   Document Released: 04/23/2004 Document Revised: 04/06/2014 Document Reviewed: 09/16/2012 Elsevier Interactive Patient Education [employer]2016 Elsevier Inc.

## 2015-07-22 NOTE — Progress Notes (Signed)
[facility] [address] [place]. [place] [address] [phone]       OFFICE FOLLOW-UP NOTE  Mr. Dakota BurgerLarry [name] Date of Birth:  08-Jun-1960 Medical Record Number:  [id number]   HPI: [name] is a 55 year old Caucasian male seen today for first office follow-up visit following hospital admission follow-up intracerebral hemorrhage in February 2017. He is accompanied by his fiance today.Dakota [name] is a 55 y.o. male with a history of hypertension who was in his normal state of health until 3 days ago at which point he began having confusion and headache (LKW 05/13/15) . He is not doing better today 2/16 and therefore sought care in the emergency room where he was found to have ICH by head CT. He was hypertensive and did not respond to labetalol and therefore the cardene was started and [facility] management. ICH Score: 0. He was admitted to the neuro ICU for further evaluation and treatment. He remained neurologically stable with close neurological monitoring. Follow-up CT scan showed stable appearance of the hemorrhage. Transthoracic echo showed normal ejection fraction without cardiac source of embolism. CT angiogram of the head and neck showed no evidence of AV malformation or aneurysm. LDL cholesterol was elevated at 154 mg percent. Hemoglobin A1c was 5.6. Patient presented with the elevated blood pressure of 176/03/31/1999 arrival blood pressure was subsequently controlled initially with parenteral medication and subsequently with tablets. He was started on Lipitor. Patient was counseled to quit alcohol patient had partial global aphasia and significant expressive language difficulties. He was initially falling on the [place] to mine commands not able to name or repeat. He was discharged home to the care of his family. Is finishing home therapies and currently getting outpatient speech therapy. He has improved  significantly and is now able to speak sentences he. He still gets stuck up with sudden words when he gets excited or tries to talk quickly. Patient states her blood pressure much better controlled after the Norvasc dose was increased which is helping. Blood pressure now is in the 110-120 range at home. Today it is 127/83 in office. Patient is gets tired more easily cannot physically do as much. He is tolerating Lipitor well with myalgias or arthralgias. He was recently started on Linzess a  medication for constipation. He has been able to drive without incident.  ROS:   14 system review of systems is positive for  headache, speech difficulty, depression, nervousness, anxiety and all other systems negative  PMH:  Past Medical History  Diagnosis Date  . Hypertension   . Depression   . Stroke (HCC)   . Anxiety     Social History:  Social History   Social History  . Marital Status: Single    Spouse Name: N/A  . Number of Children: N/A  . Years of Education: N/A   Occupational History  . Not on file.   Social History Main Topics  . Smoking status: Never Smoker   . Smokeless tobacco: Not on file  . Alcohol Use: 0.6 oz/week    1 Cans of beer per week     Comment: occasional  . Drug Use: No  . Sexual Activity: Not on file   Other Topics Concern  . Not on file   Social History Narrative    Medications:   Current Outpatient Prescriptions on File Prior to Visit  Medication Sig Dispense Refill  . acetaminophen (TYLENOL) 325 MG tablet Take 650 mg  by mouth every 6 (six) hours as needed.    Marland Kitchen atorvastatin (LIPITOR) 40 MG tablet Take 1 tablet (40 mg total) by mouth daily at 6 PM. 30 tablet 2  . citalopram (CELEXA) 20 MG tablet Take 10 mg by mouth daily.    . cloNIDine (CATAPRES) 0.1 MG tablet 1 tab po once daily as needed for blood pressure > 140/90 15 tablet 0   No current facility-administered medications on file prior to visit.    Allergies:  No Known Allergies  Physical  Exam General: well developed, well nourished middle aged Caucasian male, seated, in no evident distress Head: head normocephalic and atraumatic.  Neck: supple with no carotid or supraclavicular bruits Cardiovascular: regular rate and rhythm, no murmurs Musculoskeletal: no deformity Skin:  no rash/petichiae Vascular:  Normal pulses all extremities Filed Vitals:   07/22/15 1358  BP: 127/83  Pulse: 104   Neurologic Exam Mental Status: Awake and fully alert. Oriented to place and time. Recent and remote memory intact. Attention span, concentration and fund of knowledge appropriate. Mood and affect appropriate. Mild expressive aphasia with word finding difficulties and some naming trouble. Good comprehension and repetition Cranial Nerves: Fundoscopic exam reveals sharp disc margins. Pupils equal, briskly reactive to light. Extraocular movements full without nystagmus. Visual fields show partial right upper quadrantanopsia to confrontation. Hearing intact. Facial sensation intact. Face, tongue, palate moves normally and symmetrically.  Motor: Normal bulk and tone. Normal strength in all tested extremity muscles. Sensory.: intact to touch ,pinprick .position and vibratory sensation.  Coordination: Rapid alternating movements normal in all extremities. Finger-to-nose and heel-to-shin performed accurately bilaterally. Gait and Station: Arises from chair without difficulty. Stance is normal. Gait demonstrates normal stride length and balance . Able to heel, toe and tandem walk without difficulty.  Reflexes: 1+ and symmetric. Toes downgoing.   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 Patient has residual aphasia which seems to be improving     PLAN: I had a long d/w patient and his fiancee about his recent left temporal brain hemorrhage risk for recurrent  stroke/TIAs, personally independently reviewed imaging studies and stroke evaluation results and answered questions.Continue   strict control of hypertension with blood pressure goal below 130/90, diabetes with hemoglobin A1c goal below 6.5% and lipids with LDL cholesterol goal below 70 mg/dL. I also advised the patient to eat a healthy diet with plenty of whole grains, cereals, fruits and vegetables, exercise regularly and maintain ideal body weight .Continue ongoing outpatient speech therapy for his aphasia.Marland Kitchen He has partial upper quadrantanopsia but that should not interfere with his driving .Check follow-up CT angiogram of the brain to look for small vascular lesion not seen on initial imaging.Greater than 50% of time during this 25 minute visit was spent on counseling,explanation of diagnosis, planning of further management, discussion with patient and family and coordination of care . Followup in the future with stroke nurse practitioner in 3 months or call earlier if necessary. Delia Heady, MD  Note: This document was prepared with digital dictation and possible smart phrase technology. Any transcriptional errors that result from this process are unintentional

## 2015-07-23 ENCOUNTER — Telehealth (HOSPITAL_COMMUNITY): Payer: Self-pay

## 2015-07-23 ENCOUNTER — Ambulatory Visit (HOSPITAL_COMMUNITY): Payer: BLUE CROSS/BLUE SHIELD | Admitting: Speech Pathology

## 2015-07-23 NOTE — Telephone Encounter (Signed)
07/23/15 he is having a test on Thursday and his girl friend said she wanted him to be well rested.

## 2015-07-25 ENCOUNTER — Encounter (HOSPITAL_COMMUNITY): Payer: Self-pay | Admitting: Speech Pathology

## 2015-07-26 ENCOUNTER — Other Ambulatory Visit: Payer: BLUE CROSS/BLUE SHIELD

## 2015-07-26 ENCOUNTER — Ambulatory Visit
Admission: RE | Admit: 2015-07-26 | Discharge: 2015-07-26 | Disposition: A | Discharge status: Home / Self Care | Payer: BLUE CROSS/BLUE SHIELD | Source: Ambulatory Visit | Attending: Neurology | Admitting: Neurology

## 2015-07-26 DIAGNOSIS — I610 Nontraumatic intracerebral hemorrhage in hemisphere, subcortical: Secondary | ICD-10-CM

## 2015-07-26 MED ORDER — IOPAMIDOL (ISOVUE-370) INJECTION 76%
100.0000 mL | Freq: Once | INTRAVENOUS | Status: AC | PRN
  Administered 2015-07-26: 100 mL via INTRAVENOUS

## 2015-08-01 ENCOUNTER — Ambulatory Visit (HOSPITAL_COMMUNITY): Payer: BLUE CROSS/BLUE SHIELD | Attending: Neurology | Admitting: Speech Pathology

## 2015-08-01 DIAGNOSIS — R4701 Aphasia: Secondary | ICD-10-CM | POA: Insufficient documentation

## 2015-08-01 NOTE — Therapy (Signed)
[facility] [address] , [place], [address] Phone: [phone]   Fax:  [phone]  Speech Language Pathology Treatment  Patient Details  Name: Dakota BurgerLarry [name] MRN: [id number] Date of Birth: 1960-09-26 Referring Provider: Delia HeadyPramod Sethi  Encounter Date: [name]      End of Session - 08/01/15 1808    Visit Number 17   Number of Visits 32   Date for SLP Re-Evaluation 08/29/15   Authorization Type BCBS   SLP Start Time 1605   SLP Stop Time  1721   SLP Time Calculation (min) 76 min   Activity Tolerance Patient tolerated treatment well      Past Medical History  Diagnosis Date  . Hypertension   . Depression   . Stroke (HCC)   . Anxiety     No past surgical history on file.  There were no vitals filed for this visit.      Subjective Assessment - 08/01/15 1808    Subjective "It is great to see you!"   Patient is accompained by: Family member   Special Tests Portions of Western Aphasia Battery and other informal aphasia measures   Currently in Pain? No/denies               ADULT SLP TREATMENT - 08/01/15 1808    General Information   Behavior/Cognition Alert;Cooperative;Pleasant mood   Patient Positioning Upright in chair   Oral care provided N/A   HPI Dakota [name] presented to the ED on 05/16/15 s/p severe headache and confusion for 3 days. A CT of his head detected a intracranial hemmorhage of L parietal temporal lobe. He was discharged home on 05/21/15 and has been referred to speech therapy for evaluation and treatment.    Treatment Provided   Treatment provided Cognitive-Linquistic   Pain Assessment   Pain Assessment No/denies pain   Cognitive-Linquistic Treatment   Treatment focused on Aphasia;Patient/family/caregiver education   Skilled Treatment auditory comprehension strategies with use of written cues, multimodality communication strategies, caregiver education, word finding strategies,  awareness   Assessment / Recommendations / Plan   Plan Continue with current plan of care   Progression Toward Goals   Progression toward goals Progressing toward goals            SLP Short Term Goals - 08/01/15 1809    SLP SHORT TERM GOAL #1   Title Pt will increase naming of common objects/pictures to 90% acc when provided with min multimodality cues   change to 90% with min multimodality cues.    Baseline 0%   Time 8   Period Weeks   Status On-going   SLP SHORT TERM GOAL #2   Title Pt will complete single word sentence completion tasks [facility]) with 90% acc when provided with min multimodality cues.    Baseline 25%  Change to 90% with min multimodality cues   Time 8   Period Weeks   Status On-going   SLP SHORT TERM GOAL #3   Title Pt will verbalize automatic speech tasks (counting, days, months, songs, etc) with 80% acc when provided mod/max multimodality cues    Baseline 25%   Time 8   Period Weeks   Status Achieved   SLP SHORT TERM GOAL #4   Title Pt will complete sentence-level reading comprehension tasks with 90% acc when provided min cues.  Change to sentence-length with 90% acc with min cues   Baseline 30%   Time 8   Period Weeks   Status On-going  SLP SHORT TERM GOAL #5   Title Pt will follow 1-step verbal commands with 80% acc when provided mod cues from SLP    Baseline 60%   Time 8   Period Weeks   Status Achieved   SLP SHORT TERM GOAL #6   Title Pt will demonstrate increased auditory comprehension for moderate-level yes/no questions to 90% with min multimodality cues as needed.   Baseline 75% mod assist   Time 8   Period Weeks   Status On-going   SLP SHORT TERM GOAL #7   Title Pt will complete moderate-level mixed word finding/naming tasks with 90% acc with mi/mod assist.   Baseline Varies from 70-85% with mod assist   Time 8   Period Weeks   Status On-going          SLP Long Term Goals - 08/01/15 1810    SLP LONG TERM GOAL #1   Title Pt  will communicate moderate level wants/needs/thoughts to family and friends with use of multimodality communication strategies as needed.    Baseline max assist   Time 4   Period Months   Status On-going          Plan - 08/01/15 1809    Clinical Impression Statement Dakota [name] was accompanied by his fiance, Dakota [name]. SLP facilitated word finding and generational naming activities in session and provided initially mod support which faded to min support. He was asked to name 6 items in concrete categories and easily surpassed this so task was advanced. Pt able to name 29 animals with mi/mod SLP cues. He had difficulty pronouncing multisyllabic words (hippopotamus, elephant, kangaroo), but benefited from written cues with hash marks added between syllables. He continues to make excellent progress. Continue plan of care.    Speech Therapy Frequency 2x / week   Duration --  2 months   Treatment/Interventions Cueing hierarchy;Multimodal communcation approach;SLP instruction and feedback;Compensatory strategies;Patient/family education   Potential to Achieve Goals Good   Potential Considerations Severity of impairments   SLP Home Exercise Plan Pt will complete HEP as assigned to facilitate carryover of treatment strategies and techniques in home/community environment   Consulted and Agree with Plan of Care Patient      Patient will benefit from skilled therapeutic intervention in order to improve the following deficits and impairments:   Aphasia    Problem List Patient Active Problem List   Diagnosis Date Noted  . Hypertensive emergency 05/20/2015  . Hyperlipidemia 05/20/2015  . Depression 05/20/2015  . 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 [place], [place],  [address] Phone: [phone]   Fax:  [phone]   Name: Dakota [name] MRN: [id number] Date of Birth: 1961/01/15

## 2015-08-06 ENCOUNTER — Encounter (HOSPITAL_COMMUNITY): Payer: Self-pay | Admitting: Speech Pathology

## 2015-08-06 ENCOUNTER — Ambulatory Visit (HOSPITAL_COMMUNITY): Payer: BLUE CROSS/BLUE SHIELD | Admitting: Speech Pathology

## 2015-08-06 DIAGNOSIS — R4701 Aphasia: Secondary | ICD-10-CM

## 2015-08-06 NOTE — Therapy (Signed)
[place] [facility] [address] [place], [place], [address] Phone: [phone]   Fax:  [phone]  Speech Language Pathology Treatment  Patient Details  Name: Sherryll BurgerLarry Ferdinand MRN: [id number] Date of Birth: [birth year] Referring Provider: Delia HeadyPramod Sethi  Encounter Date: 08/06/2015      End of Session - 08/06/15 1810    Visit Number 18   Number of Visits 32   Date for SLP Re-Evaluation 08/29/15   Authorization Type BCBS   SLP Start Time 1604   SLP Stop Time  1710   SLP Time Calculation (min) 66 min   Activity Tolerance Patient tolerated treatment well      Past Medical History  Diagnosis Date  . Hypertension   . Depression   . Stroke (HCC)   . Anxiety     History reviewed. No pertinent past surgical history.  There were no vitals filed for this visit.      Subjective Assessment - 08/06/15 1809    Subjective "I am doing so well."   Patient is accompained by: Family member   Special Tests Portions of Western Aphasia Battery and other informal aphasia measures   Currently in Pain? No/denies               ADULT SLP TREATMENT - 08/06/15 1809    General Information   Behavior/Cognition Alert;Cooperative;Pleasant mood   Patient Positioning Upright in chair   Oral care provided N/A   HPI [name] presented to the ED on 05/16/15 s/p severe headache and confusion for 3 days. A CT of his head detected a intracranial hemmorhage of L parietal temporal lobe. He was discharged home on 05/21/15 and has been referred to speech therapy for evaluation and treatment.    Treatment Provided   Treatment provided Cognitive-Linquistic   Pain Assessment   Pain Assessment No/denies pain   Cognitive-Linquistic Treatment   Treatment focused on Aphasia;Patient/family/caregiver education   Skilled Treatment auditory comprehension strategies with use of written cues, multimodality communication strategies, caregiver education, word finding  strategies, awareness   Assessment / Recommendations / Plan   Plan Continue with current plan of care   Progression Toward Goals   Progression toward goals Progressing toward goals            SLP Short Term Goals - 08/06/15 1816    SLP SHORT TERM GOAL #1   Title Pt will increase naming of common objects/pictures to 90% acc when provided with min multimodality cues   change to 90% with min multimodality cues.    Baseline 0%   Time 8   Period Weeks   Status On-going   SLP SHORT TERM GOAL #2   Title Pt will complete single word sentence completion tasks [facility]) with 90% acc when provided with min multimodality cues.    Baseline 25%  Change to 90% with min multimodality cues   Time 8   Period Weeks   Status On-going   SLP SHORT TERM GOAL #3   Title Pt will verbalize automatic speech tasks (counting, days, months, songs, etc) with 80% acc when provided mod/max multimodality cues    Baseline 25%   Time 8   Period Weeks   Status Achieved   SLP SHORT TERM GOAL #4   Title Pt will complete sentence-level reading comprehension tasks with 90% acc when provided min cues.  Change to sentence-length with 90% acc with min cues   Baseline 30%   Time 8   Period Weeks   Status On-going  SLP SHORT TERM GOAL #5   Title Pt will follow 1-step verbal commands with 80% acc when provided mod cues from SLP    Baseline 60%   Time 8   Period Weeks   Status Achieved   SLP SHORT TERM GOAL #6   Title Pt will demonstrate increased auditory comprehension for moderate-level yes/no questions to 90% with min multimodality cues as needed.   Baseline 75% mod assist   Time 8   Period Weeks   Status On-going   SLP SHORT TERM GOAL #7   Title Pt will complete moderate-level mixed word finding/naming tasks with 90% acc with mi/mod assist.   Baseline Varies from 70-85% with mod assist   Time 8   Period Weeks   Status On-going          SLP Long Term Goals - 08/06/15 1817    SLP LONG TERM GOAL #1    Title Pt will communicate moderate level wants/needs/thoughts to family and friends with use of multimodality communication strategies as needed.    Baseline max assist   Time 4   Period Months   Status On-going          Plan - 08/06/15 1810    Clinical Impression Statement Mr. Gupton was accompanied by his fiance today and reports that he is doing well. He has been trying to be more active with family and friends (going to church and working in the garage). Conversational speech continues to improve and he resists SLP cues when he experiences word finding difficulties, as he wants to figure it out on his own. Pt self corrected errors ~90% of the time in conversation. SLP presented a structured word finding, memory, and attention task by having Mr. Schreur identify items that correlate with letters of the alphabet and verbalize them to SLP. He benefited from initial letter cues [facility]) and completed with 100% acc when provided with mild/mod cues for 17 items. He verbalized satisfaction with his performance. Continue targeting higher level word finding and introduce deductive reasoning task next session.    Speech Therapy Frequency 2x / week   Duration --  2 months   Treatment/Interventions Cueing hierarchy;Multimodal communcation approach;SLP instruction and feedback;Compensatory strategies;Patient/family education   Potential to Achieve Goals Good   Potential Considerations Severity of impairments   SLP Home Exercise Plan Pt will complete HEP as assigned to facilitate carryover of treatment strategies and techniques in home/community environment   Consulted and Agree with Plan of Care Patient      Patient will benefit from skilled therapeutic intervention in order to improve the following deficits and impairments:   Aphasia    Problem List Patient Active Problem List   Diagnosis Date Noted  . Hypertensive emergency 05/20/2015  . Hyperlipidemia 05/20/2015  . Depression 05/20/2015   . 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 [place], [place], [address] Phone: [phone]   Fax:  [phone]   Name: Emric Kowalewski MRN: [id number] Date of Birth: March 25, 1961

## 2015-08-08 ENCOUNTER — Ambulatory Visit (HOSPITAL_COMMUNITY): Payer: BLUE CROSS/BLUE SHIELD | Admitting: Speech Pathology

## 2015-08-08 DIAGNOSIS — R4701 Aphasia: Secondary | ICD-10-CM | POA: Diagnosis not present

## 2015-08-08 NOTE — Therapy (Signed)
[facility] [address] [place], [place], [address] Phone: [phone]   Fax:  [phone]  Speech Language Pathology Treatment  Patient Details  Name: Dakota [name] MRN: [id number] Date of Birth: 1960-06-14 Referring Provider: Delia Heady  Encounter Date: 08/08/2015      End of Session - 08/08/15 1929    Visit Number 19   Number of Visits 32   Date for SLP Re-Evaluation 08/29/15   Authorization Type BCBS   SLP Start Time 1600   SLP Stop Time  1700   SLP Time Calculation (min) 60 min   Activity Tolerance Patient tolerated treatment well      Past Medical History  Diagnosis Date  . Hypertension   . Depression   . Stroke (HCC)   . Anxiety     No past surgical history on file.  There were no vitals filed for this visit.      Subjective Assessment - 08/08/15 1928    Subjective "I have to take a new path to get to the right answer."   Patient is accompained by: Family member   Special Tests Portions of Western Aphasia Battery and other informal aphasia measures   Currently in Pain? No/denies               ADULT SLP TREATMENT - 08/08/15 1929    General Information   Behavior/Cognition Alert;Cooperative;Pleasant mood   Patient Positioning Upright in chair   Oral care provided N/A   HPI Mr. Petrak presented to the ED on 05/16/15 s/p severe headache and confusion for 3 days. A CT of his head detected a intracranial hemmorhage of L parietal temporal lobe. He was discharged home on 05/21/15 and has been referred to speech therapy for evaluation and treatment.    Treatment Provided   Treatment provided Cognitive-Linquistic   Pain Assessment   Pain Assessment No/denies pain   Cognitive-Linquistic Treatment   Treatment focused on Aphasia;Patient/family/caregiver education   Skilled Treatment auditory comprehension strategies with use of written cues, multimodality communication strategies, caregiver education,  word finding strategies, awareness   Assessment / Recommendations / Plan   Plan Continue with current plan of care   Progression Toward Goals   Progression toward goals Progressing toward goals            SLP Short Term Goals - 08/08/15 1935    SLP SHORT TERM GOAL #1   Title Pt will increase naming of common objects/pictures to 90% acc when provided with min multimodality cues   change to 90% with min multimodality cues.    Baseline 0%   Time 8   Period Weeks   Status On-going   SLP SHORT TERM GOAL #2   Title Pt will complete single word sentence completion tasks [facility]) with 90% acc when provided with min multimodality cues.    Baseline 25%  Change to 90% with min multimodality cues   Time 8   Period Weeks   Status On-going   SLP SHORT TERM GOAL #3   Title Pt will verbalize automatic speech tasks (counting, days, months, songs, etc) with 80% acc when provided mod/max multimodality cues    Baseline 25%   Time 8   Period Weeks   Status Achieved   SLP SHORT TERM GOAL #4   Title Pt will complete sentence-level reading comprehension tasks with 90% acc when provided min cues.  Change to sentence-length with 90% acc with min cues   Baseline 30%   Time 8  Period Weeks   Status On-going   SLP SHORT TERM GOAL #5   Title Pt will follow 1-step verbal commands with 80% acc when provided mod cues from SLP    Baseline 60%   Time 8   Period Weeks   Status Achieved   SLP SHORT TERM GOAL #6   Title Pt will demonstrate increased auditory comprehension for moderate-level yes/no questions to 90% with min multimodality cues as needed.   Baseline 75% mod assist   Time 8   Period Weeks   Status On-going   SLP SHORT TERM GOAL #7   Title Pt will complete moderate-level mixed word finding/naming tasks with 90% acc with mi/mod assist.   Baseline Varies from 70-85% with mod assist   Time 8   Period Weeks   Status On-going          SLP Long Term Goals - 08/08/15 1935    SLP LONG  TERM GOAL #1   Title Pt will communicate moderate level wants/needs/thoughts to family and friends with use of multimodality communication strategies as needed.    Baseline max assist   Time 4   Period Months   Status On-going          Plan - 08/08/15 1930    Clinical Impression Statement [name] was accompanied by his fiance today. Increased verbal fluency and attempts at self correction noted today. He was able to verbalize that he has had to change his way of thinking and "take a different road" to communicate effectively. [name] used self cueing strategies throughout the session to assist with verbal expression and word finding. We discussed his progress and plan for therapy. We will plan to discharge to home program at the end of next week after some additional testing. He has made tremendous progress in his recovery. Pt in agreement with plan of care.    Speech Therapy Frequency 2x / week   Duration 1 week   Treatment/Interventions Cueing hierarchy;Multimodal communcation approach;SLP instruction and feedback;Compensatory strategies;Patient/family education   Potential to Achieve Goals Good   Potential Considerations Severity of impairments   SLP Home Exercise Plan Pt will complete HEP as assigned to facilitate carryover of treatment strategies and techniques in home/community environment   Consulted and Agree with Plan of Care Patient      Patient will benefit from skilled therapeutic intervention in order to improve the following deficits and impairments:   Aphasia    Problem List Patient Active Problem List   Diagnosis Date Noted  . Hypertensive emergency 05/20/2015  . Hyperlipidemia 05/20/2015  . Depression 05/20/2015  . 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

## 2015-08-13 ENCOUNTER — Ambulatory Visit (HOSPITAL_COMMUNITY): Payer: BLUE CROSS/BLUE SHIELD | Admitting: Speech Pathology

## 2015-08-13 DIAGNOSIS — R4701 Aphasia: Secondary | ICD-10-CM

## 2015-08-13 NOTE — Therapy (Signed)
[facility] [address] [place], [place], [address] Phone: [phone]   Fax:  [phone]  Speech Language Pathology Treatment  Patient Details  Name: Dakota [name] Date of Birth: 14-Oct-1960 Referring Provider: Delia Heady  Encounter Date: 08/13/2015      End of Session - 08/13/15 1806    Visit Number 20   Number of Visits 32   Date for SLP Re-Evaluation 08/29/15   Authorization Type BCBS   SLP Start Time 1600   SLP Stop Time  1715   SLP Time Calculation (min) 75 min   Activity Tolerance Patient tolerated treatment well      Past Medical History  Diagnosis Date  . Hypertension   . Depression   . Stroke (HCC)   . Anxiety     No past surgical history on file.  There were no vitals filed for this visit.      Subjective Assessment - 08/13/15 1757    Subjective "It's a hippochopper."   Patient is accompained by: Family member   Special Tests Portions of Western Aphasia Battery and other informal aphasia measures   Currently in Pain? No/denies               ADULT SLP TREATMENT - 08/13/15 1758    General Information   Behavior/Cognition Alert;Cooperative;Pleasant mood   Patient Positioning Upright in chair   Oral care provided N/A   HPI Dakota [name] presented to the ED on 05/16/15 s/p severe headache and confusion for 3 days. A CT of his head detected a intracranial hemmorhage of L parietal temporal lobe. He was discharged home on 05/21/15 and has been referred to speech therapy for evaluation and treatment.    Treatment Provided   Treatment provided Cognitive-Linquistic   Pain Assessment   Pain Assessment No/denies pain   Cognitive-Linquistic Treatment   Treatment focused on Aphasia;Patient/family/caregiver education   Skilled Treatment auditory comprehension strategies with use of written cues, multimodality communication strategies, caregiver education, word finding strategies,  awareness   Assessment / Recommendations / Plan   Plan Continue with current plan of care   Progression Toward Goals   Progression toward goals Progressing toward goals            SLP Short Term Goals - 08/13/15 1817    SLP SHORT TERM GOAL #1   Title Pt will increase naming of common objects/pictures to 90% acc when provided with min multimodality cues   change to 90% with min multimodality cues.    Baseline 0%   Time 8   Period Weeks   Status On-going   SLP SHORT TERM GOAL #2   Title Pt will complete single word sentence completion tasks [facility]) with 90% acc when provided with min multimodality cues.    Baseline 25%  Change to 90% with min multimodality cues   Time 8   Period Weeks   Status On-going   SLP SHORT TERM GOAL #3   Title Pt will verbalize automatic speech tasks (counting, days, months, songs, etc) with 80% acc when provided mod/max multimodality cues    Baseline 25%   Time 8   Period Weeks   Status Achieved   SLP SHORT TERM GOAL #4   Title Pt will complete sentence-level reading comprehension tasks with 90% acc when provided min cues.  Change to sentence-length with 90% acc with min cues   Baseline 30%   Time 8   Period Weeks   Status On-going   SLP  SHORT TERM GOAL #5   Title Pt will follow 1-step verbal commands with 80% acc when provided mod cues from SLP    Baseline 60%   Time 8   Period Weeks   Status Achieved   SLP SHORT TERM GOAL #6   Title Pt will demonstrate increased auditory comprehension for moderate-level yes/no questions to 90% with min multimodality cues as needed.   Baseline 75% mod assist   Time 8   Period Weeks   Status On-going   SLP SHORT TERM GOAL #7   Title Pt will complete moderate-level mixed word finding/naming tasks with 90% acc with mi/mod assist.   Baseline Varies from 70-85% with mod assist   Time 8   Period Weeks   Status On-going          SLP Long Term Goals - 08/13/15 1817    SLP LONG TERM GOAL #1   Title Pt  will communicate moderate level wants/needs/thoughts to family and friends with use of multimodality communication strategies as needed.    Baseline max assist   Time 4   Period Months   Status On-going          Plan - 08/13/15 1807    Clinical Impression Statement Dakota [name] was accompanied by his fiance today and was in good spirits. He reported feeling a little tired from mowing the lawn all day yesterday. He had to receive antibiotics for a tick bite over the weekend. Homework was fully completed with 95% acc and he expressed dismay over his few errors. The [employer] Naming Test (BNT) was informally presented. He named ~35/60 independently and required cueing for the remaining. Occasional dysnomia ("I can't remember the word") present, but generally pt produced phonemic paraphasic errors ("marooshamo" for mushroom, "harmonco" for harmonica"). He independently provided circumlocution cues when unable to name. He generally recognizes that he is mispronouncing a word and works very hard to "find" the word. He also prompted himself by writing portions of the words. Dakota [name] feels much "happier" in his personal life and is learning to "let go of the small stuff". He feels comfortable discharging from speech therapy to a home program. Will see pt one more session and release to home program.   Speech Therapy Frequency 2x / week   Duration 1 week   Treatment/Interventions Cueing hierarchy;Multimodal communcation approach;SLP instruction and feedback;Compensatory strategies;Patient/family education   Potential to Achieve Goals Good   Potential Considerations Severity of impairments   SLP Home Exercise Plan Pt will complete HEP as assigned to facilitate carryover of treatment strategies and techniques in home/community environment   Consulted and Agree with Plan of Care Patient      Patient will benefit from skilled therapeutic intervention in order to improve the following deficits and  impairments:   Aphasia    Problem List Patient Active Problem List   Diagnosis Date Noted  . Hypertensive emergency 05/20/2015  . Hyperlipidemia 05/20/2015  . Depression 05/20/2015  . 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 [place], [place], [address] Phone: [phone]   Fax:  [phone]   Name: Dakota [name] MRN: [id number] Date of Birth: 29-Jun-1960

## 2015-08-15 ENCOUNTER — Ambulatory Visit (HOSPITAL_COMMUNITY): Payer: BLUE CROSS/BLUE SHIELD | Admitting: Speech Pathology

## 2015-08-15 DIAGNOSIS — R4701 Aphasia: Secondary | ICD-10-CM

## 2015-08-15 NOTE — Therapy (Signed)
[place], [place], [address] Phone: [phone]   Fax:  [phone]  Speech Language Pathology Treatment  Patient Details  Name: Dakota [name] MRN: [id number] Date of Birth: September 12, 1960 Referring Provider: Antony Contras  Encounter Date: 08/15/2015      End of Session - 08/15/15 1821    Visit Number 21   Number of Visits 32   Date for SLP Re-Evaluation 08/29/15   Authorization Type BCBS   SLP Start Time 1600   SLP Stop Time  1700   SLP Time Calculation (min) 60 min   Activity Tolerance Patient tolerated treatment well      Past Medical History  Diagnosis Date  . Hypertension   . Depression   . Stroke ([place])   . Anxiety     No past surgical history on file.  There were no vitals filed for this visit.      Subjective Assessment - 08/15/15 1805    Subjective "I will miss you."   Patient is accompained by: Family member   Special Tests Portions of Western Aphasia Battery and other informal aphasia measures   Currently in Pain? No/denies               ADULT SLP TREATMENT - 08/15/15 1806    General Information   Behavior/Cognition Alert;Cooperative;Pleasant mood   Patient Positioning Upright in chair   Oral care provided N/A   HPI [name] presented to the ED on 05/16/15 s/p severe headache and confusion for 3 days. A CT of his head detected a intracranial hemmorhage of L parietal temporal lobe. He was discharged home on 05/21/15 and has been referred to speech therapy for evaluation and treatment.    Treatment Provided   Treatment provided Cognitive-Linquistic   Pain Assessment   Pain Assessment No/denies pain   Cognitive-Linquistic Treatment   Treatment focused on Aphasia;Patient/family/caregiver education   Skilled Treatment auditory comprehension strategies with use of written cues, multimodality communication strategies, caregiver education, word finding strategies, awareness    Assessment / Recommendations / Plan   Plan All goals met;Discharge SLP treatment due to (comment)   Progression Toward Goals   Progression toward goals Goals met, education completed, patient discharged from SLP          SLP Education - 08/15/15 1816    Education provided Yes   Education Details Educated pt and fiance on home exercise program to continue addressing aphasia post discharge from therapy   Person(s) Educated Patient;Spouse   Methods Explanation;Demonstration;Handout   Comprehension Verbalized understanding          SLP Short Term Goals - 08/15/15 1827    SLP SHORT TERM GOAL #1   Title Pt will increase naming of common objects/pictures to 90% acc when provided with min multimodality cues   change to 90% with min multimodality cues.    Baseline 0%   Time 8   Period Weeks   Status Achieved   SLP SHORT TERM GOAL #2   Title Pt will complete single word sentence completion tasks [facility]) with 90% acc when provided with min multimodality cues.    Baseline 25%  Change to 90% with min multimodality cues   Time 8   Period Weeks   Status Achieved   SLP SHORT TERM GOAL #3   Title Pt will verbalize automatic speech tasks (counting, days, months, songs, etc) with 80% acc when provided mod/max multimodality cues    Baseline 25%   Time 8  Period Weeks   Status Achieved   SLP SHORT TERM GOAL #4   Title Pt will complete sentence-level reading comprehension tasks with 90% acc when provided min cues.  Change to sentence-length with 90% acc with min cues   Baseline 30%   Time 8   Period Weeks   Status Achieved   SLP SHORT TERM GOAL #5   Title Pt will follow 1-step verbal commands with 80% acc when provided mod cues from SLP    Baseline 60%   Time 8   Period Weeks   Status Achieved   SLP SHORT TERM GOAL #6   Title Pt will demonstrate increased auditory comprehension for moderate-level yes/no questions to 90% with min multimodality cues as needed.   Baseline 75% mod  assist   Time 8   Period Weeks   Status Achieved   SLP SHORT TERM GOAL #7   Title Pt will complete moderate-level mixed word finding/naming tasks with 90% acc with mi/mod assist.   Baseline Varies from 70-85% with mod assist   Time 8   Period Weeks   Status Achieved          SLP Long Term Goals - 08/15/15 1827    SLP LONG TERM GOAL #1   Title Pt will communicate moderate level wants/needs/thoughts to family and friends with use of multimodality communication strategies as needed.    Baseline max assist   Time 4   Period Months   Status Achieved          Plan - 08/15/15 1821    Clinical Impression Statement [name] was given home exercise work to continue addressing mild expressive aphasia after discharge. [name] met all short term goals and has reached a point where he would like to be discharged from speech therapy. He has made remarkable progress and improved from mod/severe mixed aphasia to mild expressive aphasia with some components of receptive aphasia as well. He would like to resume previous activities and hobbies and work on written HEP in home environment which is reasonable. He uses compensatory strategies independently (self cues to write, slow down, verbally mediate, etc). He does experience some word finding difficulties in conversation and exhibits paraphasic errors, but is generally able to self correct and make himself understood. He has been pleasure to work with and will be discharged to home program.   Treatment/Interventions Cueing hierarchy;Multimodal communcation approach;SLP instruction and feedback;Compensatory strategies;Patient/family education   Potential to Achieve Goals Good   Potential Considerations Severity of impairments   SLP Home Exercise Plan Pt will complete HEP as assigned to facilitate carryover of treatment strategies and techniques in home/community environment   Consulted and Agree with Plan of Care Patient      Patient will  benefit from skilled therapeutic intervention in order to improve the following deficits and impairments:   Aphasia    Problem List Patient Active Problem List   Diagnosis Date Noted  . Hypertensive emergency 05/20/2015  . Hyperlipidemia 05/20/2015  . Depression 05/20/2015  . ICH (intracerebral hemorrhage) (HCC) -  Large L parietal temporal hemorrhage, hypertensive 05/16/2015    SPEECH THERAPY DISCHARGE SUMMARY  Visits from Start of Care: 21  Current functional level related to goals / functional outcomes: Pt met all short and long term SLP goals   Remaining deficits: Mild expressive and receptive aphasia   Education / Equipment: Released to home program  Plan: Patient agrees to discharge.  Patient goals were met. Patient is being discharged due to being pleased  with the current functional level.  ?????       Thank you,  Genene Churn, [place]  [facility] 08/15/2015, 6:28 PM  [place] [facility] [address] [place], [place], [address] Phone: [phone]   Fax:  [phone]   Name: Rondey Fallen MRN: [id number] Date of Birth: 1960/11/25

## 2015-08-20 ENCOUNTER — Encounter (HOSPITAL_COMMUNITY): Payer: BLUE CROSS/BLUE SHIELD | Admitting: Speech Pathology

## 2015-08-22 ENCOUNTER — Encounter (HOSPITAL_COMMUNITY): Payer: BLUE CROSS/BLUE SHIELD | Admitting: Speech Pathology

## 2015-08-27 ENCOUNTER — Encounter (HOSPITAL_COMMUNITY): Payer: BLUE CROSS/BLUE SHIELD | Admitting: Speech Pathology

## 2015-10-21 ENCOUNTER — Ambulatory Visit (INDEPENDENT_AMBULATORY_CARE_PROVIDER_SITE_OTHER): Payer: BLUE CROSS/BLUE SHIELD | Admitting: Nurse Practitioner

## 2015-10-21 ENCOUNTER — Encounter: Payer: Self-pay | Admitting: Nurse Practitioner

## 2015-10-21 VITALS — BP 130/80 | HR 101 | Ht 69.0 in | Wt 213.2 lb

## 2015-10-21 DIAGNOSIS — I611 Nontraumatic intracerebral hemorrhage in hemisphere, cortical: Secondary | ICD-10-CM | POA: Diagnosis not present

## 2015-10-21 DIAGNOSIS — F329 Major depressive disorder, single episode, unspecified: Secondary | ICD-10-CM | POA: Diagnosis not present

## 2015-10-21 DIAGNOSIS — F32A Depression, unspecified: Secondary | ICD-10-CM

## 2015-10-21 DIAGNOSIS — E785 Hyperlipidemia, unspecified: Secondary | ICD-10-CM | POA: Diagnosis not present

## 2015-10-21 NOTE — Progress Notes (Signed)
I reviewed above note and agree with the assessment and plan.  Alezandra Egli, MD PhD Stroke Neurology 10/21/2015 7:19 PM    

## 2015-10-21 NOTE — Patient Instructions (Signed)
PLAN: strict control of hypertension with blood pressure goal below 130/90, Today's reading 130/80  Lipids with LDL cholesterol goal below 70 mg/dL. continue Lipitor Eat a healthy diet with plenty of whole grains, cereals, fruits and vegetables, Walk daily for exercise increase in slow increments of time and maintain ideal body weight .Continue home  speech therapy exercises for his aphasia.Molli Knock to take frequent naps for fatigue Stay well hydrated with plenty of water Follow-up in 6 months

## 2015-10-21 NOTE — Progress Notes (Signed)
[facility]  PATIENT: Dakota [name] DOB: [birth year]   REASON FOR VISIT: Follow up for intracerebral hemorrhage HISTORY FROM:patient and fianc  HISTORY OF PRESENT ILLNESS:UPDATE  07/24/2017CM. Mr. Cdebaca-55year-old male returns for follow-up. He has a history of intracerebral hemorrhage February 2017. In addition LDL was elevated. He had significant aphasia. His speech therapy concluded about a month ago and his fiance is continuing to work with him on home exercises that were given. Blood pressure in the office today well controlled at 130/80. Complains with headaches fatigue especially when out in the heat. He was cautioned not to become dehydrated and drink plenty of fluids. He is trying to walk every day about 15 minutes.Repeat CTA of the head 07/26/15 head shows expected findings of shrinkage of hematoma and CT angio shows no aneurysm or abnormal blood vessel. He returns for reevaluation. He has not had any falls, no assistive device. He is driving a vehicle without difficulty. He is currently on Celexa for depression by primary care.      HISTORY: 07/22/15 PSMr Grambo is a 55 year old Caucasian male seen today for first office follow-up visit following hospital admission follow-up intracerebral hemorrhage in February 2017. He is accompanied by his fiance today.Lin Krook is a 55 y.o. male with a history of hypertension who was in his normal state of health until 3 days ago at which point he began having confusion and headache (LKW 05/13/15) . He is not doing better today 2/16 and therefore sought care in the emergency room where he was found to have ICH by head CT. He was hypertensive and did not respond to labetalol and therefore the cardene was started and he has been transferred from [facility] to [facility] for further management. ICH Score: 0. He was admitted to the neuro ICU for further evaluation and treatment. He remained neurologically stable with  close neurological monitoring. Follow-up CT scan showed stable appearance of the hemorrhage. Transthoracic echo showed normal ejection fraction without cardiac source of embolism. CT angiogram of the head and neck showed no evidence of AV malformation or aneurysm. LDL cholesterol was elevated at 154 mg percent. Hemoglobin A1c was 5.6. Patient presented with the elevated blood pressure of 176/03/31/1999 arrival blood pressure was subsequently controlled initially with parenteral medication and subsequently with tablets. He was started on Lipitor. Patient was counseled to quit alcohol patient had partial global aphasia and significant expressive language difficulties.  He was discharged home to the care of his family. Is finishing home therapies and currently getting outpatient speech therapy. He has improved significantly and is now able to speak sentences he. He still gets stuck up with sudden words when he gets excited or tries to talk quickly. Patient states her blood pressure much better controlled after the Norvasc dose was increased which is helping. Blood pressure now is in the 110-120 range at home. Today it is 127/83 in office. Patient is gets tired more easily cannot physically do as much. He is tolerating Lipitor well with myalgias or arthralgias. He was recently started on Linzess a  medication for constipation. He has been able to drive without incident.   REVIEW OF SYSTEMS: Full 14 system review of systems performed and notable only for those listed, all others are neg:  Constitutional: Fatigue  Cardiovascular: neg Ear/Nose/Throat: neg  Skin: neg Eyes: neg Respiratory: neg Gastroitestinal: neg  Hematology/Lymphatic: neg  Endocrine: neg Musculoskeletal:neg Allergy/Immunology: neg Neurological: Occasional headache speech difficulty Psychiatric: Depression and anxiety  Sleep : neg   ALLERGIES:  No Known Allergies  HOME MEDICATIONS: Outpatient Medications Prior to Visit  Medication  Sig Dispense Refill  . acetaminophen (TYLENOL) 325 MG tablet Take 650 mg by mouth every 6 (six) hours as needed.    . ALPRAZolam (XANAX) 0.5 MG tablet Take by mouth 2 (two) times daily as needed for anxiety.   2  . amLODipine (NORVASC) 10 MG tablet Take 10 mg by mouth daily.   10  . atorvastatin (LIPITOR) 40 MG tablet Take 1 tablet (40 mg total) by mouth daily at 6 PM. 30 tablet 2  . citalopram (CELEXA) 20 MG tablet Take 10 mg by mouth daily.    . cloNIDine (CATAPRES) 0.1 MG tablet 1 tab po once daily as needed for blood pressure > 140/90 15 tablet 0  . LINZESS 145 MCG CAPS capsule   98   No facility-administered medications prior to visit.     PAST MEDICAL HISTORY: Past Medical History:  Diagnosis Date  . Anxiety   . Depression   . Hypertension   . Stroke [facility])     PAST SURGICAL HISTORY: No past surgical history on file.  FAMILY HISTORY: Family History  Problem Relation Age of Onset  . Hypertension Father   . Hypertension Brother     SOCIAL HISTORY: Social History   Social History  . Marital status: Single    Spouse name: N/A  . Number of children: N/A  . Years of education: N/A   Occupational History  . Not on file.   Social History Main Topics  . Smoking status: Never Smoker  . Smokeless tobacco: Not on file  . Alcohol use 0.6 oz/week    1 Cans of beer per week     Comment: occasional  . Drug use: No  . Sexual activity: Not on file   Other Topics Concern  . Not on file   Social History Narrative  . No narrative on file     PHYSICAL EXAM  Vitals:   10/21/15 1350  Weight: 213 lb 3.2 oz (96.7 kg)  Height: 5\' 9"  (1.753 m)   Body mass index is 31.48 kg/m. General: well developed, well nourished middle aged Caucasian male, seated, in no evident distress Head: head normocephalic and atraumatic.  Neck: supple with no carotid bruits Cardiovascular: regular rate and rhythm, no murmurs Musculoskeletal: no deformity Skin:  no rash/petichiae Vascular:   Normal pulses all extremities  Neurological examination  Mental Status: Awake and fully alert. Oriented to place and time. Recent and remote memory intact. Attention span, concentration and fund of knowledge appropriate. Mood and affect appropriate. Mild expressive aphasia with word finding difficulties and some naming trouble. Good comprehension and repetition Cranial Nerves: Fundoscopic exam reveals sharp disc margins. Pupils equal, briskly reactive to light. Extraocular movements full without nystagmus. Visual fields show partial right upper quadrantanopsia to confrontation. Hearing intact. Facial sensation intact. Face, tongue, palate moves normally and symmetrically.  Motor: Normal bulk and tone. Normal strength in all tested extremity muscles. Sensory.: intact to touch ,pinprick .position and vibratory sensation in the upper and lower extremities.  Coordination: Rapid alternating movements normal in all extremities. Finger-to-nose and heel-to-shin performed accurately bilaterally. Gait and Station: Arises from chair without difficulty. Stance is normal. Gait demonstrates normal stride length and balance . Able to heel, toe and tandem walk without difficulty.  Reflexes: 1+ and symmetric. Toes downgoing.   DIAGNOSTIC DATA (LABS, IMAGING, TESTING) - I reviewed patient records, labs, notes, testing and imaging myself where available.  Lab Results  Component Value Date   WBC 6.2 05/19/2015   HGB 14.0 05/19/2015   HCT 40.8 05/19/2015   MCV 94.0 05/19/2015   PLT 244 05/19/2015      Component Value Date/Time   NA 137 05/20/2015 0545   K 3.3 (L) 05/20/2015 0545   CL 107 05/20/2015 0545   CO2 23 05/20/2015 0545   GLUCOSE 119 (H) 05/20/2015 0545   BUN 8 05/20/2015 0545   CREATININE 0.86 05/20/2015 0545   CALCIUM 8.6 (L) 05/20/2015 0545   PROT 8.4 (H) 05/16/2015 1832   ALBUMIN 4.4 05/16/2015 1832   AST 19 05/16/2015 1832   ALT 26 05/16/2015 1832   ALKPHOS 78 05/16/2015 1832   BILITOT  1.3 (H) 05/16/2015 1832   GFRNONAA >60 05/20/2015 0545   GFRAA >60 05/20/2015 0545   Lab Results  Component Value Date   CHOL 208 (H) 05/17/2015   HDL 35 (L) 05/17/2015   LDLCALC 154 (H) 05/17/2015   TRIG 95 05/17/2015   CHOLHDL 5.9 05/17/2015   Lab Results  Component Value Date   HGBA1C 5.6 05/17/2015   Lab Results  Component Value Date   VITAMINB12 312 05/17/2015   Lab Results  Component Value Date   TSH 4.802 (H) 05/17/2015      ASSESSMENT AND PLAN 55 year old male with 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 Patient has residual aphasia which seems to be improving The patient is a current patient of Dr. Pearlean Brownie who is out of the office today . This note is sent to the work in doctor.     CT of the head 07/26/15 head shows expected findings of shrinkage of hematoma and CT angio shows no aneurysm or abnormal blood bessel.The patient is a current patient of Dr. Pearlean Brownie  who is out of the office today . This note is sent to the work in doctor.     PLAN: strict control of hypertension with blood pressure goal below 130/90, Today's reading 130/80  Lipids with LDL cholesterol goal below 70 mg/dL. continue Lipitor Eat a healthy diet with plenty of whole grains, cereals, fruits and vegetables, Walk daily for exercise increase in slow increments of time and maintain ideal body weight .Continue home  speech therapy exercises for his aphasia.Molli Knock to take frequent naps for fatigue Stay well hydrated with plenty of water Follow-up in 6 months Nilda Riggs, [facility], [facility], APRN  [facility] [address] [place], [place] [address] [phone]

## 2016-02-10 DIAGNOSIS — I639 Cerebral infarction, unspecified: Secondary | ICD-10-CM

## 2016-02-10 HISTORY — DX: Cerebral infarction, unspecified: I63.9

## 2016-04-22 ENCOUNTER — Encounter: Payer: Self-pay | Admitting: Nurse Practitioner

## 2016-04-22 ENCOUNTER — Ambulatory Visit (INDEPENDENT_AMBULATORY_CARE_PROVIDER_SITE_OTHER): Payer: BLUE CROSS/BLUE SHIELD | Admitting: Nurse Practitioner

## 2016-04-22 VITALS — BP 146/91 | HR 111 | Ht 69.0 in | Wt 223.8 lb

## 2016-04-22 DIAGNOSIS — I611 Nontraumatic intracerebral hemorrhage in hemisphere, cortical: Secondary | ICD-10-CM

## 2016-04-22 DIAGNOSIS — I10 Essential (primary) hypertension: Secondary | ICD-10-CM | POA: Diagnosis not present

## 2016-04-22 NOTE — Progress Notes (Signed)
I agree with the assessment and plan as directed by NP .The patient is known to me .   Whitman Meinhardt, MD  

## 2016-04-22 NOTE — Progress Notes (Signed)
Faxed signed release to Samuel Jesterynthia Butler, DO (Patient's PCP) to get recent labs faxed to [phone]. Asked to please include any lipid panel results.  Faxed to: [phone]. Received confirmation.

## 2016-04-22 NOTE — Patient Instructions (Addendum)
Continue to monitor risk factors Strict control of blood pressure with goal 130/90 or less continue blood pressure medications Aspirin .81mg  daily for stroke prevention Continue to walk daily for exercise Stay well hydrated Will sign to get recent labs from Dr. Herma CarsonBultler Discharge from neurologic services

## 2016-04-22 NOTE — Progress Notes (Signed)
[facility]  PATIENT: Dakota Dakota [name] DOB: [birth year]   REASON FOR VISIT: Follow up for intracerebral hemorrhage HISTORY FROM:patient and fianc  HISTORY OF PRESENT ILLNESS:UPDATE 01/24/2018CM Dakota Dakota [name], 56 year old male returns for follow-up with history of intracranial hemorrhage February 2017. He was hypertensive. In addition he had elevated LDL. On return visit today his speech is much improved. Blood pressure is taken several times a day and averages between 117-123/80 most days. Patient's primary care Dr. Charm Dakota [name] has  Taken  him off of Lipitor. He is walking every day for exercise. No falls. He is driving a vehicle without difficulty he returns for reevaluation with no neurologic complaints   UPDATE  07/24/2017CM. Dakota Dakota [name]-56year-old male returns for follow-up. He has a history of intracerebral hemorrhage February 2017. In addition LDL was elevated. He had significant aphasia. His speech therapy concluded about a month ago and his fiance is continuing to work with him on home exercises that were given. Blood pressure in the office today well controlled at 130/80. Complains with headaches fatigue especially when out in the heat. He was cautioned not to become dehydrated and drink plenty of fluids. He is trying to walk every day about 15 minutes.Repeat CTA of the head 07/26/15 head shows expected findings of shrinkage of hematoma and CT angio shows no aneurysm or abnormal blood vessel. He returns for reevaluation. He has not had any falls, no assistive device. He is driving a vehicle without difficulty. He is currently on Celexa for depression by primary care.      HISTORY: 07/22/15 PSMr Dakota [name] is a 56 year old Caucasian male seen today for first office follow-up visit following hospital admission follow-up intracerebral hemorrhage in February 2017. He is accompanied by his fiance today.Dakota Dakota [name] is a 56 y.o. male with a history of hypertension who was in  his normal state of health until 3 days ago at which point he began having confusion and headache (LKW 05/13/15) . He is not doing better today 2/16 and therefore sought care in the emergency room where he was found to have ICH by head CT. He was hypertensive and did not respond to labetalol and therefore the cardene was started and he has been transferred from [facility] to [facility] for further management. ICH Score: 0. He was admitted to the neuro ICU for further evaluation and treatment. He remained neurologically stable with close neurological monitoring. Follow-up CT scan showed stable appearance of the hemorrhage. Transthoracic echo showed normal ejection fraction without cardiac source of embolism. CT angiogram of the head and neck showed no evidence of AV malformation or aneurysm. LDL cholesterol was elevated at 154 mg percent. Hemoglobin A1c was 5.6. Patient presented with the elevated blood pressure of 176/03/31/1999 arrival blood pressure was subsequently controlled initially with parenteral medication and subsequently with tablets. He was started on Lipitor. Patient was counseled to quit alcohol patient had partial global aphasia and significant expressive language difficulties.  He was discharged home to the care of his family. Is finishing home therapies and currently getting outpatient speech therapy. He has improved significantly and is now able to speak sentences he. He still gets stuck up with sudden words when he gets excited or tries to talk quickly. Patient states her blood pressure much better controlled after the Norvasc dose was increased which is helping. Blood pressure now is in the 110-120 range at home. Today it is 127/83 in office. Patient is gets tired more easily cannot physically do as much. He is tolerating Lipitor well  with myalgias or arthralgias. He was recently started on Linzess a  medication for constipation. He has been able to drive without incident.   REVIEW OF  SYSTEMS: Full 14 system review of systems performed and notable only for those listed, all others are neg:  Constitutional: Fatigue  Cardiovascular: neg Ear/Nose/Throat: neg  Skin: neg Eyes: neg Respiratory: neg Gastroitestinal: neg  Hematology/Lymphatic: neg  Endocrine: neg Musculoskeletal:neg Allergy/Immunology: neg Neurological: Occasional headache Psychiatric: neg Sleep : neg   ALLERGIES: No Known Allergies  HOME MEDICATIONS: Outpatient Medications Prior to Visit  Medication Sig Dispense Refill  . acetaminophen (TYLENOL) 325 MG tablet Take 650 mg by mouth every 6 (six) hours as needed.    . ALPRAZolam (XANAX) 0.5 MG tablet Take by mouth 2 (two) times daily as needed for anxiety.   2  . amLODipine (NORVASC) 10 MG tablet Take 10 mg by mouth daily.   10  . citalopram (CELEXA) 20 MG tablet Take 10 mg by mouth daily.    . cloNIDine (CATAPRES) 0.1 MG tablet 1 tab po once daily as needed for blood pressure > 140/90 15 tablet 0  . LINZESS 145 MCG CAPS capsule   98  . zolpidem (AMBIEN) 10 MG tablet 10 mg at bedtime as needed.   2  . atorvastatin (LIPITOR) 40 MG tablet Take 1 tablet (40 mg total) by mouth daily at 6 PM. 30 tablet 2   No facility-administered medications prior to visit.     PAST MEDICAL HISTORY: Past Medical History:  Diagnosis Date  . Anxiety   . Depression   . Hypertension   . Stroke [facility])     PAST SURGICAL HISTORY: History reviewed. No pertinent surgical history.  FAMILY HISTORY: Family History  Problem Relation Age of Onset  . Hypertension Father   . Hypertension Brother     SOCIAL HISTORY: Social History   Social History  . Marital status: Single    Spouse name: N/A  . Number of children: N/A  . Years of education: N/A   Occupational History  . Not on file.   Social History Main Topics  . Smoking status: Never Smoker  . Smokeless tobacco: Never Used  . Alcohol use 0.6 oz/week    1 Cans of beer per week     Comment: occasional  .  Drug use: No  . Sexual activity: Not on file   Other Topics Concern  . Not on file   Social History Narrative  . No narrative on file     PHYSICAL EXAM  Vitals:   04/22/16 1414  BP: (!) 146/91  Pulse: (!) 111  Weight: 223 lb 12.8 oz (101.5 kg)  Height: 5\' 9"  (1.753 m)   Body mass index is 33.05 kg/m. General: well developed, well nourished Male in no evident distress Head: head normocephalic and atraumatic.  Neck: supple with no carotid bruits Cardiovascular: regular rate and rhythm, no murmurs Musculoskeletal: no deformity Skin:  no rash/petichiae Vascular:  Normal pulses all extremities  Neurological examination  Mental Status: Awake and fully alert. Oriented to place and time. Recent and remote memory intact. Attention span, concentration and fund of knowledge appropriate. Mood and affect appropriate. Speech fluent Good comprehension and repetition Cranial Nerves: Fundoscopic exam reveals sharp disc margins. Pupils equal, briskly reactive to light. Extraocular movements full without nystagmus. Visual fields show partial right upper quadrantanopsia to confrontation. Hearing intact. Facial sensation intact. Face, tongue, palate moves normally and symmetrically.  Motor: Normal bulk and tone. Normal strength in all  tested extremity muscles. Sensory.: intact to touch ,pinprick .position and vibratory sensation in the upper and lower extremities.  Coordination: Rapid alternating movements normal in all extremities. Finger-to-nose and heel-to-shin performed accurately bilaterally. Gait and Station: Arises from chair without difficulty. Stance is normal. Gait demonstrates normal stride length and balance . Able to heel, toe and tandem walk without difficulty.  Reflexes: 1+ and symmetric. Toes downgoing.   DIAGNOSTIC DATA (LABS, IMAGING, TESTING) - I reviewed patient records, labs, notes, testing and imaging myself where available.  Lab Results  Component Value Date   WBC 6.2  05/19/2015   HGB 14.0 05/19/2015   HCT 40.8 05/19/2015   MCV 94.0 05/19/2015   PLT 244 05/19/2015      Component Value Date/Time   NA 137 05/20/2015 0545   K 3.3 (L) 05/20/2015 0545   CL 107 05/20/2015 0545   CO2 23 05/20/2015 0545   GLUCOSE 119 (H) 05/20/2015 0545   BUN 8 05/20/2015 0545   CREATININE 0.86 05/20/2015 0545   CALCIUM 8.6 (L) 05/20/2015 0545   PROT 8.4 (H) 05/16/2015 1832   ALBUMIN 4.4 05/16/2015 1832   AST 19 05/16/2015 1832   ALT 26 05/16/2015 1832   ALKPHOS 78 05/16/2015 1832   BILITOT 1.3 (H) 05/16/2015 1832   GFRNONAA >60 05/20/2015 0545   GFRAA >60 05/20/2015 0545   Lab Results  Component Value Date   CHOL 208 (H) 05/17/2015   HDL 35 (L) 05/17/2015   LDLCALC 154 (H) 05/17/2015   TRIG 95 05/17/2015   CHOLHDL 5.9 05/17/2015   Lab Results  Component Value Date   HGBA1C 5.6 05/17/2015   Lab Results  Component Value Date   VITAMINB12 312 05/17/2015   Lab Results  Component Value Date   TSH 4.802 (H) 05/17/2015   CT of the head 07/26/15 head shows expected findings of shrinkage of hematoma and CT angio shows no aneurysm or abnormal blood besse   ASSESSMENT AND PLAN 56 year old male with 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 The patient is a current patient of Dr. Pearlean BrownieSethi who is out of the office today . This note is sent to the work in doctor.      PLAN: Continue to control stroke risk factors  strict control of hypertension with blood pressure goal below 130/90, continue to take 1-2 times a week Eat a healthy diet with plenty of whole grains, cereals, fruits and vegetables, Walk daily for exercise .Marland Kitchen.  Aspirin 0.81 mg daily for secondary stroke prevention Stay well hydrated with plenty of water Will sign to get recent labs from Dr. Charm BargesButler  Discharge from neurologic services  Nilda RiggsNancy Carolyn Martin, [facility] Beach Psychiatric CenterGNP, [facility]reaBC, APRN  [facility] [address], [address] [place], [place] [address] [phone]

## 2017-07-17 IMAGING — CT CT ANGIO HEAD
1 of 12 series · 1 of 33 positions shown · IV contrast (Iohexol (Omnipaque 350))
Comparison: CT head 05/16/2015 at 2276 hours

CLINICAL DATA: Headache and confusion for 3 days. History of
hypertension.

EXAM:
CT ANGIOGRAPHY HEAD AND NECK
TECHNIQUE: Multidetector CT imaging of the head and neck was performed using
the standard protocol during bolus administration of intravenous
contrast. Multiplanar CT image reconstructions and MIPs were
obtained to evaluate the vascular anatomy. Carotid stenosis
measurements (when applicable) are obtained utilizing NASCET
criteria, using the distal internal carotid diameter as the
denominator.
CONTRAST:  50mL OMNIPAQUE IOHEXOL 350 MG/ML SOLN

[Series 300: locator · axial · 0.28mm/px · 1 of 1 slices shown]
[im 1/1  soft-tissue]
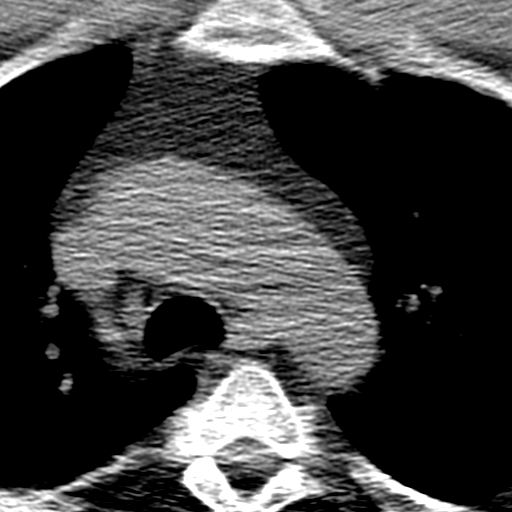

[1 of 33 positions shown; findings below may reference images not displayed]

FINDINGS: CT HEAD

Calvarium and skull base: No fracture or destructive lesion.
Mastoids and middle ears are grossly clear.

Paranasal sinuses: Imaged portions are clear.

Orbits: Negative.

Brain: The in intraparenchymal hemorrhage demonstrated on
yesterday's exam is mildly increased, now approximately 26 x 40 x 27
mm, LEFT superior temporal lobe location. Via the LEFT temporal
horn, the clot has extended into the lateral ventricles, where blood
layers posteriorly. There is 5 mm of LEFT-to-RIGHT shift, and there
is early mass effect of the LEFT uncus into the suprasellar cistern.
Slight brainstem rotation. No acute PCA infarct is evident.

CTA NECK

Aortic arch: Standard branching. Imaged portion shows no evidence of
aneurysm or dissection. No significant stenosis of the major arch
vessel origins.

Right carotid system: No evidence of dissection, stenosis (50% or
greater) or occlusion.

Left carotid system: No evidence of dissection, stenosis (50% or
greater) or occlusion.

Vertebral arteries: Codominant. No evidence of dissection, stenosis
(50% or greater) or occlusion.

Other: No neck masses. No lung apex lesion. Mild spondylosis but no
worrisome osseous abnormality. Poor dentition.

CTA HEAD

Anterior circulation: Elevated LEFT sylvian fissure and resultant
LEFT MCA M1 trunk, trifurcation, and distal MCA branches. No
evidence for saccular aneurysm. No LEFT MCA branch occlusion,
dissection, or visible mycotic aneurysm. No significant stenosis,
proximal occlusion, aneurysm, or vascular malformation.

Posterior circulation: No significant stenosis, proximal occlusion,
aneurysm, or vascular malformation.

Venous sinuses: As permitted by contrast timing, patent.

Anatomic variants: None of significance.

Delayed phase:   No abnormal intracranial enhancement.
IMPRESSION: Extrinsic mass effect on the LEFT anterior circulation from a large
intraparenchymal hematoma, likely hypertensive in etiology.

No associated vascular malformation or visible saccular or mycotic
aneurysm. No evidence for MCA dissection.

No abnormal postcontrast enhancement to suggest neoplasm.

Increasing size compared with 05/16/2015 noncontrast CT. Based on
cross-sectional measurements of 26 x 40 x 27 mm, approximate volume
14 mL. 5 mm LEFT-to-RIGHT shift with mild medialization of the LEFT
uncus. Continued surveillance is warranted.

## 2017-10-26 ENCOUNTER — Emergency Department (HOSPITAL_COMMUNITY)
Admission: EM | Admit: 2017-10-26 | Discharge: 2017-10-26 | Disposition: A | Discharge status: Home / Self Care | Payer: BLUE CROSS/BLUE SHIELD | Attending: Emergency Medicine | Admitting: Emergency Medicine

## 2017-10-26 ENCOUNTER — Other Ambulatory Visit: Payer: Self-pay

## 2017-10-26 DIAGNOSIS — Z8673 Personal history of transient ischemic attack (TIA), and cerebral infarction without residual deficits: Secondary | ICD-10-CM | POA: Insufficient documentation

## 2017-10-26 DIAGNOSIS — Z7982 Long term (current) use of aspirin: Secondary | ICD-10-CM | POA: Insufficient documentation

## 2017-10-26 DIAGNOSIS — F329 Major depressive disorder, single episode, unspecified: Secondary | ICD-10-CM | POA: Diagnosis not present

## 2017-10-26 DIAGNOSIS — I10 Essential (primary) hypertension: Secondary | ICD-10-CM | POA: Insufficient documentation

## 2017-10-26 DIAGNOSIS — Z79899 Other long term (current) drug therapy: Secondary | ICD-10-CM | POA: Insufficient documentation

## 2017-10-26 DIAGNOSIS — Z008 Encounter for other general examination: Secondary | ICD-10-CM | POA: Diagnosis not present

## 2017-10-26 DIAGNOSIS — F429 Obsessive-compulsive disorder, unspecified: Secondary | ICD-10-CM | POA: Diagnosis not present

## 2017-10-26 DIAGNOSIS — F419 Anxiety disorder, unspecified: Secondary | ICD-10-CM | POA: Diagnosis present

## 2017-10-26 LAB — COMPREHENSIVE METABOLIC PANEL
ALK PHOS: 86 U/L (ref 38–126)
ALT: 37 U/L (ref 0–44)
ANION GAP: 10 (ref 5–15)
AST: 25 U/L (ref 15–41)
Albumin: 3.9 g/dL (ref 3.5–5.0)
BILIRUBIN TOTAL: 1 mg/dL (ref 0.3–1.2)
BUN: 19 mg/dL (ref 6–20)
CALCIUM: 9.3 mg/dL (ref 8.9–10.3)
CO2: 24 mmol/L (ref 22–32)
Chloride: 104 mmol/L (ref 98–111)
Creatinine, Ser: 1.07 mg/dL (ref 0.61–1.24)
Glucose, Bld: 115 mg/dL — ABNORMAL HIGH (ref 70–99)
Potassium: 3.3 mmol/L — ABNORMAL LOW (ref 3.5–5.1)
SODIUM: 138 mmol/L (ref 135–145)
TOTAL PROTEIN: 7.5 g/dL (ref 6.5–8.1)

## 2017-10-26 LAB — CBC
HCT: 49.7 % (ref 39.0–52.0)
Hemoglobin: 16.9 g/dL (ref 13.0–17.0)
MCH: 32.3 pg (ref 26.0–34.0)
MCHC: 34 g/dL (ref 30.0–36.0)
MCV: 95 fL (ref 78.0–100.0)
PLATELETS: 311 10*3/uL (ref 150–400)
RBC: 5.23 MIL/uL (ref 4.22–5.81)
RDW: 12.1 % (ref 11.5–15.5)
WBC: 7.7 10*3/uL (ref 4.0–10.5)

## 2017-10-26 LAB — RAPID URINE DRUG SCREEN, HOSP PERFORMED
AMPHETAMINES: NOT DETECTED
BENZODIAZEPINES: POSITIVE — AB
Barbiturates: NOT DETECTED
Cocaine: NOT DETECTED
OPIATES: NOT DETECTED
Tetrahydrocannabinol: NOT DETECTED

## 2017-10-26 LAB — ETHANOL: Alcohol, Ethyl (B): <10 mg/dL (ref <10)

## 2017-10-26 NOTE — ED Triage Notes (Signed)
Patient was taken a medication for anxiety, stopped taking it and then restarted it 3 weeks ago.  Saw family phys today for same. Here for Voluntary committment.

## 2017-10-26 NOTE — BH Assessment (Signed)
Nira ConnJason Berry, PA, patient does not meet inpatient criteria. Dr. Charm BargesButler and Lowella BandyNikki, RN notified of disposition.

## 2017-10-26 NOTE — Discharge Instructions (Signed)
Please follow up with your doctors and allow your medicine to fully take effect

## 2017-10-26 NOTE — BH Assessment (Addendum)
Assessment Note  Dakota [name] is an 57 y.o. male. evaluation on anxiety and request for psychiatric evaluation. Patient reported history of anxiety, depression, ADD, ADHD and OCD. Patient got off Celexa 20 mg 07/2017 and started back 09/2017. His PCP started him on Celexa 10mg  3 weeks ago and increased to 20 mg last Thursday. Patient is currently taking Celexa 20 mg and 0.5 ativan prn. Patient was referred to ER for evaluation after seeing his PCP, Dr. Christian Mate at [facility] for his symptoms and for admission to [facility]. Patient reported never seeing a psychiatrist/psychologist. Patient denies SI, HI and Psychosis. Patient reported waking up every 1-2 hours and staying awake, taking "cat naps" throughout the night. Patient reported poor appetite. Patient stated he initially started Celexa approx 2 years ago when he had a stroke, which he then struggled with depression and anxiety. Patient after stroke had to learn how to speak and read again. Patient reported not being able to focus, increased anxiety and OCD, patient stated, "my OCD is the worst, I just can't do it, I can't turn loose of some things".   Patient was cooperative, logical and coherent. Patient mood was anxious and sad. Patient displayed moderate level of anxiety. Patient was oriented x4. Patient gave feedback and was aware of his body adjustments to increased medications. Patient admitted that the medication is working but has not taking full effect and that he may need a couple more days for medicine to take full effect.   Disposition: Reviewed with Physician:  Nira Conn, PA patient does not meet inpatient criteria. Dr. Charm Barges and Lowella Bandy, RN notified of disposition.  Diagnosis: hx Anxiety D/O, OCD and Major Depressive D/O  Past Medical History:  Past Medical History:  Diagnosis Date  . Anxiety   . Depression   . Hypertension   . Stroke [facility])     No past surgical history on file.  Family History:  Family History   Problem Relation Age of Onset  . Hypertension Father   . Hypertension Brother     Social History:  reports that he has never smoked. He has never used smokeless tobacco. He reports that he drinks about 0.6 oz of alcohol per week. He reports that he does not use drugs.  Additional Social History:  Alcohol / Drug Use Pain Medications: see MAR Prescriptions: see MAR Over the Counter: see MAR  CIWA: CIWA-Ar BP: (!) 150/94 Pulse Rate: (!) 101 COWS:    Allergies:  Allergies  Allergen Reactions  . Peanut-Containing Drug Products   . Sulfa Antibiotics     Home Medications:  (Not in a hospital admission)  OB/GYN Status:  No LMP for male patient.  General Assessment Data Location of Assessment: [facility] ED TTS Assessment: In system Is this a Tele or Face-to-Face Assessment?: Tele Assessment Is this an Initial Assessment or a Re-assessment for this encounter?: Initial Assessment Marital status: Married Living Arrangements: Spouse/significant other Can pt return to current living arrangement?: Yes Admission Status: Voluntary Is patient capable of signing voluntary admission?: Yes Referral Source: (Dr. Christian Mate, family doctor)     Crisis Care Plan Living Arrangements: Spouse/significant other Legal Guardian: (self) Name of Psychiatrist: none Name of Therapist: none  Education Status Is patient currently in school?: No Is the patient employed, unemployed or receiving disability?: Employed  Risk to self with the past 6 months Suicidal Ideation: No Has patient been a risk to self within the past 6 months prior to admission? : No Suicidal Intent: No Has  patient had any suicidal intent within the past 6 months prior to admission? : No Is patient at risk for suicide?: No Suicidal Plan?: No Has patient had any suicidal plan within the past 6 months prior to admission? : No Access to Means: No What has been your use of drugs/alcohol within the last 12 months?: none Previous  Attempts/Gestures: No How many times?: 0 Other Self Harm Risks: none Triggers for Past Attempts: (n/a) Intentional Self Injurious Behavior: None Family Suicide History: No Recent stressful life event(s): Recent negative physical changes(recovering from stroke) Persecutory voices/beliefs?: No Depression: Yes Depression Symptoms: Insomnia Substance abuse history and/or treatment for substance abuse?: No Suicide prevention information given to non-admitted patients: Not applicable  Risk to Others within the past 6 months Homicidal Ideation: No Does patient have any lifetime risk of violence toward others beyond the six months prior to admission? : No Thoughts of Harm to Others: No Current Homicidal Intent: No Current Homicidal Plan: No Access to Homicidal Means: No Identified Victim: n/a History of harm to others?: No Assessment of Violence: None Noted Violent Behavior Description: n/a Does patient have access to weapons?: No Criminal Charges Pending?: No Does patient have a court date: No Is patient on probation?: No  Psychosis Hallucinations: None noted Delusions: None noted  Mental Status Report Appearance/Hygiene: Unremarkable Eye Contact: Good Motor Activity: Unremarkable, Freedom of movement Speech: Logical/coherent Level of Consciousness: Alert Mood: Sad, Anxious Affect: Anxious, Sad Anxiety Level: Moderate Thought Processes: Coherent Judgement: Unimpaired Orientation: Person, Place, Time, Situation Obsessive Compulsive Thoughts/Behaviors: Severe(per client)  Cognitive Functioning Concentration: Normal Memory: Recent Intact, Remote Intact Is patient IDD: No Is patient DD?: No Insight: Good Impulse Control: Fair Appetite: Poor Have you had any weight changes? : No Change Sleep: Decreased Total Hours of Sleep: 3 Vegetative Symptoms: None  ADLScreening [facility] Assessment Services) Patient's cognitive ability adequate to safely complete daily activities?:  Yes Patient able to express need for assistance with ADLs?: Yes Independently performs ADLs?: Yes (appropriate for developmental age)  Prior Inpatient Therapy Prior Inpatient Therapy: No  Prior Outpatient Therapy Prior Outpatient Therapy: No Does patient have an ACCT team?: No Does patient have Intensive In-House Services?  : No Does patient have [facility] services? : No Does patient have P4CC services?: No  ADL Screening (condition at time of admission) Patient's cognitive ability adequate to safely complete daily activities?: Yes Patient able to express need for assistance with ADLs?: Yes Independently performs ADLs?: Yes (appropriate for developmental age)             Disposition:  Disposition Initial Assessment Completed for this Encounter: Yes Patient referred to: Other (Comment)(Dr. Christian Mateindy Butler for medication management of anxiety)  On Site Evaluation by:  Al CorpusLatisha Licia Harl, [facility] Reviewed with Physician:  Nira ConnJason Berry, PA patient does not meet inpatient criteria. Dr. Charm BargesButler and Lowella BandyNikki, RN notified of disposition.  Burnetta SabinLatisha D Eland Lamantia 10/26/2017 10:03 PM

## 2017-10-26 NOTE — ED Provider Notes (Signed)
[facility] EMERGENCY DEPARTMENT Provider Note   CSN: [id number] Arrival date & time: 10/26/17  1930     History   Chief Complaint Chief Complaint  Patient presents with  . Anxiety    HPI Dakota [name] is a 57 y.o. male presenting for evaluation on anxiety and request for psychiatric evaluation.   Pt states he had a h/o anxiety, depression, add,a nd ocd. He was on medications, stopped 3 weeks ago, and restarted 5 days ago. He is now on celexa, 0.5 ativan prn. He states he saw his PCP today who sent him to the ER for evaluation of his sxs and for admission to [facility]. He states he has never seen a psychiatrist or psychologist. He denies SI/HI/AVH. Deneis fevers, chills, cp, sob, n/v, and pain, urinary sxs, leg swelling. He states he has been waking up every 1-2 hrs, and not falling asleep for several hours.   HPI  Past Medical History:  Diagnosis Date  . Anxiety   . Depression   . Hypertension   . Stroke [facility])     Patient Active Problem List   Diagnosis Date Noted  . HTN (hypertension) 04/22/2016  . Hypertensive emergency 05/20/2015  . Hyperlipidemia 05/20/2015  . Depression 05/20/2015  . 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      Home Medications    Prior to Admission medications   Medication Sig Start Date End Date Taking? Authorizing Provider  acetaminophen (TYLENOL) 325 MG tablet Take 650 mg by mouth every 6 (six) hours as needed.    [provider]  ALPRAZolam Prudy Feeler) 0.5 MG tablet Take by mouth 2 (two) times daily as needed for anxiety.  07/16/15   [provider]  amLODipine (NORVASC) 10 MG tablet Take 10 mg by mouth daily.  07/05/15   [provider]  aspirin EC 81 MG tablet Take 81 mg by mouth daily.    [provider]  citalopram (CELEXA) 20 MG tablet Take 10 mg by mouth daily.    [provider]    cloNIDine (CATAPRES) 0.1 MG tablet 1 tab po once daily as needed for blood pressure > 140/90 05/27/15   Blane Ohara, MD  cyclobenzaprine (FLEXERIL) 10 MG tablet Take 10 mg by mouth as needed. 04/20/16   [provider]  LINZESS 145 MCG CAPS capsule  07/03/15   [provider]  zolpidem (AMBIEN) 10 MG tablet 10 mg at bedtime as needed.  10/17/15   [provider]    Family History Family History  Problem Relation Age of Onset  . Hypertension Father   . Hypertension Brother     Social History Social History   Tobacco Use  . Smoking status: Never Smoker  . Smokeless tobacco: Never Used  Substance Use Topics  . Alcohol use: Yes    Alcohol/week: 0.6 oz    Types: 1 Cans of beer per week    Comment: occasional  . Drug use: No     Allergies   Peanut-containing drug products and Sulfa antibiotics   Review of Systems Review of Systems  Psychiatric/Behavioral: Positive for sleep disturbance. The patient is nervous/anxious.   All other systems reviewed and are negative.    Physical Exam Updated Vital Signs BP (!) 150/94 (BP Location: Right Arm)   Pulse (!) 101   Temp 99 F (37.2 C) (Oral)   Resp 18   Ht 5\' 9"  (  1.753 m)   Wt 100.2 kg (221 lb)   SpO2 100%   BMI 32.64 kg/m   Physical Exam  Constitutional: He is oriented to person, place, and time. He appears well-developed and well-nourished. No distress.  HENT:  Head: Normocephalic and atraumatic.  Eyes: EOM are normal.  Neck: Normal range of motion.  Pulmonary/Chest: Effort normal.  Abdominal: He exhibits no distension.  Musculoskeletal: Normal range of motion.  Neurological: He is alert and oriented to person, place, and time.  Skin: Skin is warm. No rash noted.  Psychiatric: His mood appears anxious. His speech is rapid and/or pressured. He expresses no homicidal and no suicidal ideation. He expresses no suicidal plans and no homicidal plans.  Pt appears anxious. Speech is rapid. Denies  SI/HI/AVH  Nursing note and vitals reviewed.    ED Treatments / Results  Labs (all labs ordered are listed, but only abnormal results are displayed) Labs Reviewed  COMPREHENSIVE METABOLIC PANEL  ETHANOL  CBC  RAPID URINE DRUG SCREEN, HOSP PERFORMED    EKG None  Radiology No results found.  Procedures Procedures (including critical care time)  Medications Ordered in ED Medications - No data to display   Initial Impression / Assessment and Plan / ED Course  I have reviewed the triage vital signs and the nursing notes.  Pertinent labs & imaging results that were available during my care of the patient were reviewed by me and considered in my medical decision making (see chart for details).     Pt presenting for evaluation of anxiety. History consistent with anxiety, less likely underlying medical etiology, including acs pe, infection. Will obtain labs and ua for medical clearance.   Oncoming PA, H Khatri made aware pt has labs and TTS consult pending.   Final Clinical Impressions(s) / ED Diagnoses   Final diagnoses:  None    ED Discharge Orders    None       Drema BalzarineCaccavale, Aily Tzeng, PA-C 10/26/17 2113    Azalia Bilisampos, Kevin, MD 10/26/17 2241

## 2017-12-04 ENCOUNTER — Other Ambulatory Visit: Payer: Self-pay

## 2017-12-04 ENCOUNTER — Emergency Department (HOSPITAL_COMMUNITY): Payer: Medicare Other

## 2017-12-04 ENCOUNTER — Encounter (HOSPITAL_COMMUNITY): Payer: Self-pay | Admitting: Emergency Medicine

## 2017-12-04 ENCOUNTER — Emergency Department (HOSPITAL_COMMUNITY)
Admission: EM | Admit: 2017-12-04 | Discharge: 2017-12-04 | Disposition: A | Discharge status: Home / Self Care | Payer: Medicare Other | Attending: Emergency Medicine | Admitting: Emergency Medicine

## 2017-12-04 DIAGNOSIS — I10 Essential (primary) hypertension: Secondary | ICD-10-CM | POA: Diagnosis not present

## 2017-12-04 DIAGNOSIS — Z79899 Other long term (current) drug therapy: Secondary | ICD-10-CM | POA: Diagnosis not present

## 2017-12-04 DIAGNOSIS — R040 Epistaxis: Secondary | ICD-10-CM | POA: Insufficient documentation

## 2017-12-04 DIAGNOSIS — Z8673 Personal history of transient ischemic attack (TIA), and cerebral infarction without residual deficits: Secondary | ICD-10-CM | POA: Insufficient documentation

## 2017-12-04 DIAGNOSIS — Z91010 Allergy to peanuts: Secondary | ICD-10-CM | POA: Diagnosis not present

## 2017-12-04 DIAGNOSIS — Z7982 Long term (current) use of aspirin: Secondary | ICD-10-CM | POA: Diagnosis not present

## 2017-12-04 LAB — CBC WITH DIFFERENTIAL/PLATELET
BASOS ABS: 0 10*3/uL (ref 0.0–0.1)
BASOS PCT: 0 %
EOS ABS: 0 10*3/uL (ref 0.0–0.7)
EOS PCT: 1 %
HEMATOCRIT: 45.2 % (ref 39.0–52.0)
Hemoglobin: 15.6 g/dL (ref 13.0–17.0)
Lymphocytes Relative: 24 %
Lymphs Abs: 1.2 10*3/uL (ref 0.7–4.0)
MCH: 33.1 pg (ref 26.0–34.0)
MCHC: 34.5 g/dL (ref 30.0–36.0)
MCV: 95.8 fL (ref 78.0–100.0)
MONO ABS: 0.3 10*3/uL (ref 0.1–1.0)
MONOS PCT: 6 %
NEUTROS ABS: 3.6 10*3/uL (ref 1.7–7.7)
Neutrophils Relative %: 69 %
Platelets: 228 10*3/uL (ref 150–400)
RBC: 4.72 MIL/uL (ref 4.22–5.81)
RDW: 13.1 % (ref 11.5–15.5)
WBC: 5.2 10*3/uL (ref 4.0–10.5)

## 2017-12-04 LAB — BASIC METABOLIC PANEL
ANION GAP: 8 (ref 5–15)
BUN: 13 mg/dL (ref 6–20)
CALCIUM: 8.9 mg/dL (ref 8.9–10.3)
CO2: 24 mmol/L (ref 22–32)
CREATININE: 0.89 mg/dL (ref 0.61–1.24)
Chloride: 106 mmol/L (ref 98–111)
GFR calc Af Amer: >60 mL/min (ref >60)
GFR calc non Af Amer: >60 mL/min (ref >60)
GLUCOSE: 160 mg/dL — AB (ref 70–99)
Potassium: 4.4 mmol/L (ref 3.5–5.1)
Sodium: 138 mmol/L (ref 135–145)

## 2017-12-04 LAB — PROTIME-INR
INR: 0.87
PROTHROMBIN TIME: 11.7 s (ref 11.4–15.2)

## 2017-12-04 MED ORDER — OXYMETAZOLINE HCL 0.05 % NA SOLN
NASAL | Status: AC
  Filled 2017-12-04: qty 15

## 2017-12-04 MED ORDER — SILVER NITRATE-POT NITRATE 75-25 % EX MISC
CUTANEOUS | Status: AC
  Administered 2017-12-04: 12:00:00
  Filled 2017-12-04: qty 2

## 2017-12-04 NOTE — ED Notes (Signed)
Pt returned from xray

## 2017-12-04 NOTE — ED Notes (Signed)
Pt blew out large clot out of nose, after rt nare has a slow trickle of blood, with no active bleeding from lt nare.  Pressure device put back in place.

## 2017-12-04 NOTE — ED Provider Notes (Signed)
[facility] EMERGENCY DEPARTMENT Provider Note   CSN: [id number] Arrival date & time: 12/04/17  4098     History   Chief Complaint Chief Complaint  Patient presents with  . Epistaxis    HPI Dakota [name] is a 57 y.o. male.  HPI   Dakota [name] is a 57 y.o. male with past medical history of hypertension and previous cortical hemorrhage in 2017, who presents to the Emergency Department complaining of sudden onset of right-sided nosebleed that began this morning during attempted defecation.  He states that he was straining to have a bowel movement and felt something "pop" in his nose and he developed persistent bleeding that was not controlled with pressure.  He states he has been spitting out a quarter size clots.  He denies headache, dizziness, visual changes, numbness, weakness, or changes in speech.  He states he does take his antihypertensive medicine daily and also takes an 81 mg aspirin daily.   Past Medical History:  Diagnosis Date  . Anxiety   . Depression   . Hypertension   . Stroke [facility])     Patient Active Problem List   Diagnosis Date Noted  . HTN (hypertension) 04/22/2016  . Hypertensive emergency 05/20/2015  . Hyperlipidemia 05/20/2015  . Depression 05/20/2015  . 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 No pertinent surgical history.    Home Medications    Prior to Admission medications   Medication Sig Start Date End Date Taking? Authorizing Provider  aspirin EC 81 MG tablet Take 81 mg by mouth daily.   Yes [provider]  clonazePAM (KLONOPIN) 0.5 MG tablet Take 0.5 mg by mouth 2 (two) times daily as needed for anxiety.   Yes [provider]  cloNIDine (CATAPRES) 0.1 MG tablet 1 tab po once daily as needed for blood pressure > 140/90 05/27/15  Yes Blane Ohara, MD  acetaminophen (TYLENOL) 325 MG tablet Take 650 mg by mouth every 6 (six) hours as  needed.    [provider]  LINZESS 145 MCG CAPS capsule as needed.  07/03/15   [provider]  zolpidem (AMBIEN) 10 MG tablet 10 mg at bedtime as needed.  10/17/15   [provider]    Family History Family History  Problem Relation Age of Onset  . Hypertension Father   . Hypertension Brother     Social History Social History   Tobacco Use  . Smoking status: Never Smoker  . Smokeless tobacco: Never Used  Substance Use Topics  . Alcohol use: Yes    Alcohol/week: 1.0 standard drinks    Types: 1 Cans of beer per week    Comment: occasional  . Drug use: No     Allergies   Peanut-containing drug products; Sulfa antibiotics; and Watermelon flavor   Review of Systems Review of Systems  Constitutional: Negative for appetite change, chills and fever.  HENT: Positive for nosebleeds. Negative for trouble swallowing.   Eyes: Negative for visual disturbance.  Respiratory: Negative for chest tightness and shortness of breath.   Cardiovascular: Negative for chest pain.  Gastrointestinal: Negative for abdominal pain, nausea and vomiting.  Skin: Negative for rash.  Neurological: Negative for dizziness, syncope, speech difficulty, weakness, numbness and headaches.  Hematological: Does not bruise/bleed easily.  Psychiatric/Behavioral: Negative for confusion.     Physical Exam Updated Vital Signs BP (!) 166/102 (BP Location: Right Arm)   Pulse (!) 112   Temp 97.7 F (36.5  C) (Oral)   Resp 20   Ht 5\' 9"  (1.753 m)   Wt 97.5 kg   SpO2 98%   BMI 31.75 kg/m   Physical Exam  Constitutional: He is oriented to person, place, and time. He appears well-developed and well-nourished. No distress.  HENT:  Nose: No nasal septal hematoma. Epistaxis is observed.  Mouth/Throat: Uvula is midline. No uvula swelling.  Active bleeding from the right nostril.  Bright red blood in the mouth.  Eyes: Pupils are equal, round, and reactive to light. Conjunctivae and EOM  are normal.  Neck: Normal range of motion. No JVD present.  Cardiovascular: Normal rate, regular rhythm and normal heart sounds.  Pulmonary/Chest: Effort normal and breath sounds normal. No respiratory distress.  Musculoskeletal: Normal range of motion.  Neurological: He is alert and oriented to person, place, and time. He has normal strength. No sensory deficit. GCS eye subscore is 4. GCS verbal subscore is 5. GCS motor subscore is 6.  CN II-XII grossly intact.  Speech clear, no pronator drift.  Normal finger-nose testing.  Skin: Skin is warm. Capillary refill takes less than 2 seconds.  Nursing note and vitals reviewed.    ED Treatments / Results  Labs (all labs ordered are listed, but only abnormal results are displayed) Labs Reviewed  BASIC METABOLIC PANEL - Abnormal; Notable for the following components:      Result Value   Glucose, Bld 160 (*)    All other components within normal limits  CBC WITH DIFFERENTIAL/PLATELET  PROTIME-INR    EKG None  Radiology Ct Head Wo Contrast  Result Date: 12/04/2017 CLINICAL DATA:  Epistaxis beginning this morning. Previous intraparenchymal and subarachnoid hemorrhage. EXAM: CT HEAD WITHOUT CONTRAST TECHNIQUE: Contiguous axial images were obtained from the base of the skull through the vertex without intravenous contrast. COMPARISON:  07/26/2015 FINDINGS: Brain: No evidence of acute infarction, hemorrhage, hydrocephalus, extra-axial collection, or mass lesion/mass effect. Volume loss left temporal lobe with ex vacuo dilatation of the temporal horn is seen, consistent with chronic encephalomalacia. Vascular:  No hyperdense vessel or other acute findings. Skull: No evidence of fracture or other significant bone abnormality. Sinuses/Orbits: Air-fluid levels seen in visualized portion of upper right maxillary sinus, suspicious for acute sinusitis. Other: None. IMPRESSION: Chronic left temporal lobe encephalomalacia. No acute intracranial abnormality.  Right maxillary sinus air-fluid level, suspicious for acute sinusitis. Electronically Signed   By: Myles Rosenthal M.D.   On: 12/04/2017 11:24    Procedures .Epistaxis Management Date/Time: 12/04/2017 12:00 PM Performed by: Pauline Aus, PA-C Authorized by: Pauline Aus, PA-C   Consent:    Consent obtained:  Verbal   Consent given by:  Patient   Risks discussed:  Infection and bleeding   Alternatives discussed:  Alternative treatment Anesthesia (see MAR for exact dosages):    Anesthesia method:  Topical application   Topical anesthesia: afrin nasal spray. Procedure details:    Treatment site:  R anterior   Treatment method:  Silver nitrate   Treatment complexity:  Limited   Treatment episode: initial   Post-procedure details:    Assessment:  Bleeding stopped   Patient tolerance of procedure:  Tolerated well, no immediate complications   (including critical care time)   Medications Ordered in ED Medications  oxymetazoline (AFRIN) 0.05 % nasal spray (has no administration in time range)  silver nitrate applicators 75-25 % applicator (has no administration in time range)     Initial Impression / Assessment and Plan / ED Course  I have reviewed the  triage vital signs and the nursing notes.  Pertinent labs & imaging results that were available during my care of the patient were reviewed by me and considered in my medical decision making (see chart for details).     Active right-sided epistaxis upon arrival and spitting up large clots of bright red blood.  Direct pressure applied.  History of hypertension and previous hemorrhagic stroke, will obtain labs and CT head.  Patient is mildly hypertensive, has taken his antihypertensive medication.   Had patient blow his nose and clean clots out, Afrin applied, bleeding controlled.  Friable area to the anterior septum seen.  CT scan negative for acute intracranial bleed.  Discussed applying Rhino Rocket but patient declined  procedure.  Agrees to cautery with silver nitrate.  Discussed possible risks of recurrent bleeding and patient verbalized understanding, but continues to decline nasal packing.  Patient has been observed in the department after therapeutic procedure, no further epistaxis he is ambulated in the department without difficulty and states he is ready for discharge home.  Agrees to close ENT follow-up and return precautions also discussed.   Final Clinical Impressions(s) / ED Diagnoses   Final diagnoses:  Epistaxis  Hypertension, unspecified type    ED Discharge Orders    None       Pauline Aus, PA-C 12/06/17 1832    Samuel Jester, DO 12/07/17 1007

## 2017-12-04 NOTE — ED Notes (Signed)
Pt instructed to pinch nose together tightly, tilt head forward and avoid blowing nose.

## 2017-12-04 NOTE — ED Triage Notes (Signed)
Nose bleed from both nostrils started this morning.  Pt spitting out some quarter size clots.

## 2017-12-04 NOTE — ED Notes (Signed)
Patient transported to CT 

## 2017-12-04 NOTE — Discharge Instructions (Addendum)
Avoid heavy lifting, bending, or straining for 1 week.  Apply firm pressure to the right side of your nose if you feel you have to sneeze and sneeze out your mouth.  Avoid blowing your nose for 1 week.  You may apply Afrin nasal spray 1 to 2 sprays to each nostril twice a day for 3 days then stop.  Your blood pressure today is elevated.  You will need to call your primary provider to arrange a follow-up appointment.  Return to the ER for any continued bleeding from your nose that is not controlled with 15-20 minutes of direct pressure.

## 2021-03-02 ENCOUNTER — Other Ambulatory Visit: Payer: Self-pay

## 2021-03-02 ENCOUNTER — Encounter (HOSPITAL_COMMUNITY): Payer: Self-pay | Admitting: *Deleted

## 2021-03-02 ENCOUNTER — Emergency Department (HOSPITAL_COMMUNITY): Payer: Medicare Other

## 2021-03-02 ENCOUNTER — Emergency Department (HOSPITAL_COMMUNITY)
Admission: EM | Admit: 2021-03-02 | Discharge: 2021-03-02 | Disposition: A | Discharge status: Home / Self Care | Payer: Medicare Other | Attending: Emergency Medicine | Admitting: Emergency Medicine

## 2021-03-02 DIAGNOSIS — K4020 Bilateral inguinal hernia, without obstruction or gangrene, not specified as recurrent: Secondary | ICD-10-CM

## 2021-03-02 DIAGNOSIS — R1013 Epigastric pain: Secondary | ICD-10-CM | POA: Diagnosis not present

## 2021-03-02 DIAGNOSIS — Z91010 Allergy to peanuts: Secondary | ICD-10-CM | POA: Diagnosis not present

## 2021-03-02 DIAGNOSIS — I10 Essential (primary) hypertension: Secondary | ICD-10-CM | POA: Insufficient documentation

## 2021-03-02 DIAGNOSIS — Z7982 Long term (current) use of aspirin: Secondary | ICD-10-CM | POA: Diagnosis not present

## 2021-03-02 LAB — COMPREHENSIVE METABOLIC PANEL
ALT: 63 U/L — ABNORMAL HIGH (ref 0–44)
AST: 35 U/L (ref 15–41)
Albumin: 4.4 g/dL (ref 3.5–5.0)
Alkaline Phosphatase: 75 U/L (ref 38–126)
Anion gap: 9 (ref 5–15)
BUN: 13 mg/dL (ref 8–23)
CO2: 21 mmol/L — ABNORMAL LOW (ref 22–32)
Calcium: 9.2 mg/dL (ref 8.9–10.3)
Chloride: 101 mmol/L (ref 98–111)
Creatinine, Ser: 0.87 mg/dL (ref 0.61–1.24)
GFR, Estimated: >60 mL/min (ref >60)
Glucose, Bld: 150 mg/dL — ABNORMAL HIGH (ref 70–99)
Potassium: 4.1 mmol/L (ref 3.5–5.1)
Sodium: 131 mmol/L — ABNORMAL LOW (ref 135–145)
Total Bilirubin: 0.7 mg/dL (ref 0.3–1.2)
Total Protein: 7.8 g/dL (ref 6.5–8.1)

## 2021-03-02 LAB — CBC WITH DIFFERENTIAL/PLATELET
Abs Immature Granulocytes: 0.01 10*3/uL (ref 0.00–0.07)
Basophils Absolute: 0 10*3/uL (ref 0.0–0.1)
Basophils Relative: 0 %
Eosinophils Absolute: 0 10*3/uL (ref 0.0–0.5)
Eosinophils Relative: 0 %
HCT: 46.8 % (ref 39.0–52.0)
Hemoglobin: 16.9 g/dL (ref 13.0–17.0)
Immature Granulocytes: 0 %
Lymphocytes Relative: 21 %
Lymphs Abs: 1 10*3/uL (ref 0.7–4.0)
MCH: 33.3 pg (ref 26.0–34.0)
MCHC: 36.1 g/dL — ABNORMAL HIGH (ref 30.0–36.0)
MCV: 92.3 fL (ref 80.0–100.0)
Monocytes Absolute: 0.3 10*3/uL (ref 0.1–1.0)
Monocytes Relative: 6 %
Neutro Abs: 3.4 10*3/uL (ref 1.7–7.7)
Neutrophils Relative %: 73 %
Platelets: 298 10*3/uL (ref 150–400)
RBC: 5.07 MIL/uL (ref 4.22–5.81)
RDW: 12.3 % (ref 11.5–15.5)
WBC: 4.7 10*3/uL (ref 4.0–10.5)
nRBC: 0 % (ref 0.0–0.2)

## 2021-03-02 LAB — TROPONIN I (HIGH SENSITIVITY)
Troponin I (High Sensitivity): 4 ng/L (ref <18)
Troponin I (High Sensitivity): 3 ng/L (ref <18)

## 2021-03-02 LAB — LIPASE, BLOOD: Lipase: 37 U/L (ref 11–51)

## 2021-03-02 MED ORDER — ONDANSETRON HCL 4 MG/2ML IJ SOLN
4.0000 mg | Freq: Once | INTRAMUSCULAR | Status: AC
  Administered 2021-03-02: 09:00:00 4 mg via INTRAVENOUS
  Filled 2021-03-02: qty 2

## 2021-03-02 MED ORDER — SODIUM CHLORIDE 0.9 % IV SOLN: INTRAVENOUS | Status: DC

## 2021-03-02 MED ORDER — OMEPRAZOLE 20 MG PO CPDR: 20.0000 mg | DELAYED_RELEASE_CAPSULE | Freq: Two times a day (BID) | ORAL | 0 refills | Status: DC

## 2021-03-02 MED ORDER — HYDROMORPHONE HCL 1 MG/ML IJ SOLN
1.0000 mg | Freq: Once | INTRAMUSCULAR | Status: AC
  Administered 2021-03-02: 09:00:00 1 mg via INTRAVENOUS
  Filled 2021-03-02: qty 1

## 2021-03-02 MED ORDER — IOHEXOL 300 MG/ML  SOLN
100.0000 mL | Freq: Once | INTRAMUSCULAR | Status: AC | PRN
  Administered 2021-03-02: 10:00:00 100 mL via INTRAVENOUS

## 2021-03-02 NOTE — Discharge Instructions (Signed)
Trial of Prilosec.  Also make an appointment to follow-up with gastroenterology information above.  Call tomorrow to set up an appointment.  May need an upper endoscopy to evaluate stomach for ulcers or other problems.  Return for any new or worse symptoms.  Today's work-up otherwise very reassuring.

## 2021-03-02 NOTE — ED Provider Notes (Addendum)
[facility] EMERGENCY DEPARTMENT Provider Note   CSN: [id number] Arrival date & time: 03/02/21  0865     History Chief Complaint  Patient presents with   Abdominal Pain    Dakota [name] is a 60 y.o. male.  Patient with a complaint of abdominal pain kind of umbilicus up to the epigastric area at least for a week.  But his wife states that it may be going on a little bit longer.  Patient states that he does eat a lot of hot food and he likes it but it has been causing some increased burning.  Occasionally has to do Pepto-Bismol and settles it down.  She also had some abdominal distention or bloating.      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 No pertinent surgical history.     Family History  Problem Relation Age of Onset   Hypertension Father    Hypertension Brother     Social History   Tobacco Use   Smoking status: Never   Smokeless tobacco: Never  Vaping Use   Vaping Use: Never used  Substance Use Topics   Alcohol use: Not Currently    Alcohol/week: 1.0 standard drink    Types: 1 Cans of beer per week    Comment: occasional   Drug use: No    Home Medications Prior to Admission medications   Medication Sig Start Date End Date Taking? Authorizing Provider  omeprazole (PRILOSEC) 20 MG capsule Take 1 capsule (20 mg total) by mouth 2 (two) times daily before a meal. 03/02/21  Yes Vanetta Mulders, MD  acetaminophen (TYLENOL) 325 MG tablet Take 650 mg by mouth every 6 (six) hours as needed.    [provider]  aspirin EC 81 MG tablet Take 81 mg by mouth daily.    [provider]   clonazePAM (KLONOPIN) 0.5 MG tablet Take 0.5 mg by mouth 2 (two) times daily as needed for anxiety.    [provider]  cloNIDine (CATAPRES) 0.1 MG tablet 1 tab po once daily as needed for blood pressure > 140/90 05/27/15   Blane Ohara, MD  LINZESS 145 MCG CAPS capsule as needed.  07/03/15   [provider]  zolpidem (AMBIEN) 10 MG tablet 10 mg at bedtime as needed.  10/17/15   [provider]    Allergies    Peanut-containing drug products, Sulfa antibiotics, and Watermelon flavor  Review of Systems   Review of Systems  Constitutional:  Negative for chills and fever.  HENT:  Negative for ear pain and sore throat.   Eyes:  Negative for pain and visual disturbance.  Respiratory:  Negative for cough and shortness of breath.   Cardiovascular:  Negative for chest pain and palpitations.  Gastrointestinal:  Positive for abdominal distention, abdominal pain and nausea. Negative for vomiting.  Genitourinary:  Negative for dysuria and hematuria.  Musculoskeletal:  Negative for arthralgias and back pain.  Skin:  Negative for color change and rash.  Neurological:  Negative for seizures and syncope.  All other systems reviewed and are negative.  Physical Exam Updated Vital Signs BP 126/80  Pulse 70   Temp 98.4 F (36.9 C) (Oral)   Resp 14   Ht 1.753 m (5\' 9" )   Wt 93 kg   SpO2 97%   BMI 30.27 kg/m   Physical Exam Vitals and nursing note reviewed.  Constitutional:      General: He is not in acute distress.    Appearance: Normal appearance. He is well-developed.  HENT:     Head: Normocephalic and atraumatic.  Eyes:     Extraocular Movements: Extraocular movements intact.     Conjunctiva/sclera: Conjunctivae normal.     Pupils: Pupils are equal, round, and reactive to light.  Cardiovascular:     Rate and Rhythm: Normal rate and regular rhythm.     Heart sounds: No murmur heard. Pulmonary:     Effort: Pulmonary effort is normal. No respiratory distress.      Breath sounds: Normal breath sounds.  Abdominal:     General: There is distension.     Palpations: Abdomen is soft.     Tenderness: There is no abdominal tenderness. There is no guarding.  Musculoskeletal:        General: No swelling.     Cervical back: Normal range of motion and neck supple.  Skin:    General: Skin is warm and dry.     Capillary Refill: Capillary refill takes less than 2 seconds.  Neurological:     General: No focal deficit present.     Mental Status: He is alert and oriented to person, place, and time.  Psychiatric:        Mood and Affect: Mood normal.    ED Results / Procedures / Treatments   Labs (all labs ordered are listed, but only abnormal results are displayed) Labs Reviewed  COMPREHENSIVE METABOLIC PANEL - Abnormal; Notable for the following components:      Result Value   Sodium 131 (*)    CO2 21 (*)    Glucose, Bld 150 (*)    ALT 63 (*)    All other components within normal limits  CBC WITH DIFFERENTIAL/PLATELET - Abnormal; Notable for the following components:   MCHC 36.1 (*)    All other components within normal limits  LIPASE, BLOOD  TROPONIN I (HIGH SENSITIVITY)  TROPONIN I (HIGH SENSITIVITY)    EKG None  Radiology CT Abdomen Pelvis W Contrast  Result Date: 03/02/2021 CLINICAL DATA:  Abdominal distension, abdominal pain, acute, nonlocalized. Mid to upper abdominal pain, nausea and bloating for 1 week. EXAM: CT ABDOMEN AND PELVIS WITH CONTRAST TECHNIQUE: Multidetector CT imaging of the abdomen and pelvis was performed using the standard protocol following bolus administration of intravenous contrast. CONTRAST:  14/06/2020 OMNIPAQUE IOHEXOL 300 MG/ML  SOLN COMPARISON:  None. FINDINGS: Lower chest: No acute abnormality. Hepatobiliary: No focal liver abnormality is seen. No gallstones, gallbladder wall thickening, or biliary dilatation. Pancreas: Unremarkable. No pancreatic ductal dilatation or surrounding inflammatory changes. Spleen: Normal in  size without focal abnormality. Adrenals/Urinary Tract: Adrenal glands appear normal. Kidneys appear normal without mass, stone or hydronephrosis. No ureteral or bladder calculi are identified. Bladder appears normal. Stomach/Bowel: No dilated large or small bowel loops. Scattered diverticulosis within the colon but no focal inflammatory change to suggest acute diverticulitis. Fluid is seen throughout the nondistended small bowel. Stomach is unremarkable. Vascular/Lymphatic: Aortic atherosclerosis. No acute-appearing vascular abnormality. No enlarged lymph nodes seen in the abdomen or pelvis. Reproductive: Prostate is mildly prominent. Other: No free fluid or abscess collection. No free intraperitoneal air. Musculoskeletal: Mild degenerative spondylosis within  the lower lumbar spine. No acute-appearing osseous abnormality. Bilateral inguinal hernias which contain fat only, LEFT greater than RIGHT. IMPRESSION: 1. Fluid is seen throughout the nondistended small bowel. This can be a secondary sign of a mild enteritis of infectious or inflammatory nature. However, no bowel wall thickening or mesenteric inflammation to confirm an underlying enteritis. 2. No bowel obstruction. 3. Colonic diverticulosis without evidence of acute diverticulitis. 4. Bilateral inguinal hernias which contain fat only, LEFT greater than RIGHT. 5. Prostate gland is mildly prominent in size. Recommend correlation with physical exam findings and/or PSA lab values. Aortic Atherosclerosis (ICD10-I70.0). Electronically Signed   By: Franki Cabot M.D.   On: 03/02/2021 10:16   DG Chest Port 1 View  Result Date: 03/02/2021 CLINICAL DATA:  Abdominal pain EXAM: PORTABLE CHEST 1 VIEW COMPARISON:  None. FINDINGS: The heart size and mediastinal contours are within normal limits. Both lungs are clear. The visualized skeletal structures are unremarkable. IMPRESSION: No acute abnormality of the lungs in AP portable projection. Electronically Signed   By:  Delanna Ahmadi M.D.   On: 03/02/2021 09:11    Procedures Procedures   Medications Ordered in ED Medications  0.9 %  sodium chloride infusion ( Intravenous New Bag/Given 03/02/21 0856)  ondansetron (ZOFRAN) injection 4 mg (4 mg Intravenous Given 03/02/21 0859)  HYDROmorphone (DILAUDID) injection 1 mg (1 mg Intravenous Given 03/02/21 0859)  iohexol (OMNIPAQUE) 300 MG/ML solution 100 mL (100 mLs Intravenous Contrast Given 03/02/21 0948)    ED Course  I have reviewed the triage vital signs and the nursing notes.  Pertinent labs & imaging results that were available during my care of the patient were reviewed by me and considered in my medical decision making (see chart for details).    MDM Rules/Calculators/A&P                           Patient's work-up labs without any significant abnormalities.  Troponins normal.  Chest x-ray negative.  No significant liver function test abnormalities.  Blood sugar up a little bit at 150 so may have some borderline diabetes.  White count is normal.  Hemoglobin normal CT showed no bowel obstruction.  There is fluid throughout the nondistended small bowel so maybe has some mild enteritis.  But patient not having diarrhea.  Colon is normal other than diverticulosis.  Does have bilateral renal hernias which contain fat only.  Have patient follow-up with Dr. Arnoldo Morale regarding the bilateral inguinal hernias.  With a more pressing concern is the epigastric abdominal pain which may represent a stomach ulcer.  We will have patient take Prilosec.  And follow-up with gastroenterology.  Patient not having any diarrhea.  Even though there may be some component of enteritis on the CT scan.   Final Clinical Impression(s) / ED Diagnoses Final diagnoses:  Epigastric pain  Non-recurrent bilateral inguinal hernia without obstruction or gangrene    Rx / DC Orders ED Discharge Orders          Ordered    omeprazole (PRILOSEC) 20 MG capsule  2 times daily before meals         03/02/21 1122             Fredia Sorrow, MD 03/02/21 1126    Fredia Sorrow, MD 03/02/21 1132    Fredia Sorrow, MD 03/02/21 1133

## 2021-03-02 NOTE — ED Triage Notes (Signed)
Pt c/o mid to upper abdominal pain, nausea and bloating x 1 week.

## 2022-10-17 ENCOUNTER — Emergency Department (HOSPITAL_COMMUNITY): Payer: Medicare Other

## 2022-10-17 ENCOUNTER — Other Ambulatory Visit: Payer: Self-pay

## 2022-10-17 ENCOUNTER — Emergency Department (HOSPITAL_COMMUNITY)
Admission: EM | Admit: 2022-10-17 | Discharge: 2022-10-17 | Disposition: A | Discharge status: Home / Self Care | Payer: Medicare Other | Attending: Student | Admitting: Student

## 2022-10-17 ENCOUNTER — Encounter (HOSPITAL_COMMUNITY): Payer: Self-pay | Admitting: Emergency Medicine

## 2022-10-17 DIAGNOSIS — R1084 Generalized abdominal pain: Secondary | ICD-10-CM

## 2022-10-17 DIAGNOSIS — Z7982 Long term (current) use of aspirin: Secondary | ICD-10-CM | POA: Diagnosis not present

## 2022-10-17 DIAGNOSIS — Z91010 Allergy to peanuts: Secondary | ICD-10-CM | POA: Diagnosis not present

## 2022-10-17 DIAGNOSIS — I10 Essential (primary) hypertension: Secondary | ICD-10-CM | POA: Insufficient documentation

## 2022-10-17 LAB — COMPREHENSIVE METABOLIC PANEL
ALT: 34 U/L (ref 0–44)
AST: 27 U/L (ref 15–41)
Albumin: 4 g/dL (ref 3.5–5.0)
Alkaline Phosphatase: 68 U/L (ref 38–126)
Anion gap: 7 (ref 5–15)
BUN: 17 mg/dL (ref 8–23)
CO2: 25 mmol/L (ref 22–32)
Calcium: 8.8 mg/dL — ABNORMAL LOW (ref 8.9–10.3)
Chloride: 102 mmol/L (ref 98–111)
Creatinine, Ser: 1.01 mg/dL (ref 0.61–1.24)
GFR, Estimated: >60 mL/min (ref >60)
Glucose, Bld: 156 mg/dL — ABNORMAL HIGH (ref 70–99)
Potassium: 3.8 mmol/L (ref 3.5–5.1)
Sodium: 134 mmol/L — ABNORMAL LOW (ref 135–145)
Total Bilirubin: 0.8 mg/dL (ref 0.3–1.2)
Total Protein: 7.7 g/dL (ref 6.5–8.1)

## 2022-10-17 LAB — URINALYSIS, ROUTINE W REFLEX MICROSCOPIC
Bacteria, UA: NONE SEEN
Bilirubin Urine: NEGATIVE
Glucose, UA: NEGATIVE mg/dL
Ketones, ur: NEGATIVE mg/dL
Leukocytes,Ua: NEGATIVE
Nitrite: NEGATIVE
Protein, ur: NEGATIVE mg/dL
Specific Gravity, Urine: 1.008 (ref 1.005–1.030)
pH: 7 (ref 5.0–8.0)

## 2022-10-17 LAB — CBC WITH DIFFERENTIAL/PLATELET
Abs Immature Granulocytes: 0.01 10*3/uL (ref 0.00–0.07)
Basophils Absolute: 0 10*3/uL (ref 0.0–0.1)
Basophils Relative: 1 %
Eosinophils Absolute: 0.1 10*3/uL (ref 0.0–0.5)
Eosinophils Relative: 1 %
HCT: 43 % (ref 39.0–52.0)
Hemoglobin: 15 g/dL (ref 13.0–17.0)
Immature Granulocytes: 0 %
Lymphocytes Relative: 34 %
Lymphs Abs: 1.6 10*3/uL (ref 0.7–4.0)
MCH: 32.7 pg (ref 26.0–34.0)
MCHC: 34.9 g/dL (ref 30.0–36.0)
MCV: 93.7 fL (ref 80.0–100.0)
Monocytes Absolute: 0.3 10*3/uL (ref 0.1–1.0)
Monocytes Relative: 7 %
Neutro Abs: 2.7 10*3/uL (ref 1.7–7.7)
Neutrophils Relative %: 57 %
Platelets: 257 10*3/uL (ref 150–400)
RBC: 4.59 MIL/uL (ref 4.22–5.81)
RDW: 12.4 % (ref 11.5–15.5)
WBC: 4.8 10*3/uL (ref 4.0–10.5)
nRBC: 0 % (ref 0.0–0.2)

## 2022-10-17 LAB — LIPASE, BLOOD: Lipase: 44 U/L (ref 11–51)

## 2022-10-17 MED ORDER — IOHEXOL 300 MG/ML  SOLN
100.0000 mL | Freq: Once | INTRAMUSCULAR | Status: AC | PRN
  Administered 2022-10-17: 100 mL via INTRAVENOUS

## 2022-10-17 NOTE — ED Triage Notes (Signed)
Pt reports mid abdominal pain that started 2 years ago "on and off." PT reports the past 2 weeks the pain has become worse. Denies fevers.

## 2022-10-17 NOTE — ED Provider Notes (Signed)
[place] EMERGENCY DEPARTMENT AT [facility] Provider Note  CSN: [id number] Arrival date & time: 10/17/22 1841  Chief Complaint(s) Abdominal Pain  HPI Ewald Beg is a 62 y.o. male with PMH previous intracranial hemorrhage secondary to hypertensive emergency, anxiety, depression who presents emergency department for evaluation of abdominal pain and distention.  Patient states symptoms have been present for approximately 1 week and are intermittent.  States that he feels generalized abdominal pain when this happens and cannot localize it to a single quadrant.  No associated nausea, vomiting, chest pain, shortness of breath or other systemic symptoms.  Here in the emergency department today he is symptom-free.   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 Authorizing Provider  acetaminophen (TYLENOL) 325 MG tablet Take 650 mg by mouth every 6 (six) hours as needed.    [provider]  aspirin EC 81 MG tablet Take 81 mg by mouth daily.    [provider]  clonazePAM (KLONOPIN) 0.5 MG tablet Take 0.5 mg by mouth 2 (two) times daily as needed for anxiety.    [provider]  cloNIDine (CATAPRES) 0.1 MG tablet 1 tab po once daily as needed for blood pressure > 140/90 05/27/15   Blane Ohara, MD  LINZESS 145 MCG CAPS capsule as needed.  07/03/15   [provider]  omeprazole (PRILOSEC) 20 MG capsule Take 1 capsule (20 mg total) by mouth 2 (two) times daily before a meal. 03/02/21   Vanetta Mulders,  MD  zolpidem (AMBIEN) 10 MG tablet 10 mg at bedtime as needed.  10/17/15   [provider]                                                                                                                                    Past Surgical History History reviewed. No pertinent surgical history. Family History Family History  Problem Relation Age of Onset   Hypertension Father    Hypertension Brother     Social History Social History   Tobacco Use   Smoking status: Never   Smokeless tobacco: Never  Vaping Use   Vaping status: Never Used  Substance Use Topics   Alcohol use: Not Currently    Alcohol/week: 1.0 standard drink of alcohol    Types: 1 Cans of beer per week    Comment: occasional   Drug use: No   Allergies Peanut-containing drug products, Sulfa antibiotics, and Watermelon flavor  Review of Systems Review  of Systems  Gastrointestinal:  Positive for abdominal distention and abdominal pain.    Physical Exam Vital Signs  I have reviewed the triage vital signs BP 133/85   Pulse 78   Temp 98.7 F (37.1 C) (Oral)   Resp 16   SpO2 94%   Physical Exam Constitutional:      General: He is not in acute distress.    Appearance: Normal appearance.  HENT:     Head: Normocephalic and atraumatic.     Nose: No congestion or rhinorrhea.  Eyes:     General:        Right eye: No discharge.        Left eye: No discharge.     Extraocular Movements: Extraocular movements intact.     Pupils: Pupils are equal, round, and reactive to light.  Cardiovascular:     Rate and Rhythm: Normal rate and regular rhythm.     Heart sounds: No murmur heard. Pulmonary:     Effort: No respiratory distress.     Breath sounds: No wheezing or rales.  Abdominal:     General: There is no distension.     Tenderness: There is no abdominal tenderness.  Musculoskeletal:        General: Normal range of motion.     Cervical back: Normal range of motion.  Skin:    General: Skin is warm  and dry.  Neurological:     General: No focal deficit present.     Mental Status: He is alert.     ED Results and Treatments Labs (all labs ordered are listed, but only abnormal results are displayed) Labs Reviewed  COMPREHENSIVE METABOLIC PANEL - Abnormal; Notable for the following components:      Result Value   Sodium 134 (*)    Glucose, Bld 156 (*)    Calcium 8.8 (*)    All other components within normal limits  URINALYSIS, ROUTINE W REFLEX MICROSCOPIC - Abnormal; Notable for the following components:   Color, Urine STRAW (*)    Hgb urine dipstick SMALL (*)    All other components within normal limits  CBC WITH DIFFERENTIAL/PLATELET  LIPASE, BLOOD                                                                                                                          Radiology CT ABDOMEN PELVIS W CONTRAST  Result Date: 10/17/2022 CLINICAL DATA:  Acute abdominal pain increased over the past few weeks, initial encounter EXAM: CT ABDOMEN AND PELVIS WITH CONTRAST TECHNIQUE: Multidetector CT imaging of the abdomen and pelvis was performed using the standard protocol following bolus administration of intravenous contrast. RADIATION DOSE REDUCTION: This exam was performed according to the departmental dose-optimization program which includes automated exposure control, adjustment of the mA and/or kV according to patient size and/or use of iterative reconstruction technique. CONTRAST:  OMNIPAQUE IOHEXOL 300 MG/ML  SOLN COMPARISON:  03/02/2021 FINDINGS: Lower chest: No acute abnormality. Hepatobiliary: No focal liver  abnormality is seen. No gallstones, gallbladder wall thickening, or biliary dilatation. Pancreas: Unremarkable. No pancreatic ductal dilatation or surrounding inflammatory changes. Spleen: Normal in size without focal abnormality. Adrenals/Urinary Tract: Adrenal glands are within normal limits. Kidneys demonstrate a normal enhancement pattern bilaterally. No renal calculi are  seen. No obstructive changes are noted. The bladder is well distended. Stomach/Bowel: No obstructive or inflammatory changes of the colon are noted. Scattered diverticular changes are seen. The appendix is within normal limits. Small bowel and stomach are unremarkable. Vascular/Lymphatic: No significant vascular findings are present. No enlarged abdominal or pelvic lymph nodes. Reproductive: Prostate is unremarkable. Other: No abdominal wall hernia or abnormality. No abdominopelvic ascites. Musculoskeletal: No acute or significant osseous findings. IMPRESSION: No acute abnormality noted. Electronically Signed   By: Alcide Clever M.D.   On: 10/17/2022 20:28    Pertinent labs & imaging results that were available during my care of the patient were reviewed by me and considered in my medical decision making (see MDM for details).  Medications Ordered in ED Medications  iohexol (OMNIPAQUE) 300 MG/ML solution 100 mL (100 mLs Intravenous Contrast Given 10/17/22 2010)                                                                                                                                     Procedures Procedures  (including critical care time)  Medical Decision Making / ED Course   This patient presents to the ED for concern of abdominal pain, this involves an extensive number of treatment options, and is a complaint that carries with it a high risk of complications and morbidity.  The differential diagnosis includes diverticulitis, epiploic appendagitis, colitis, gastroenteritis, constipation, nephrolithiasis, inflammatory bowel disease,   MDM: Patient seen emerged part for evaluation of abdominal pain.  Physical exam is largely unremarkable with a soft, nontender abdomen.  Laboratory valuation with some mild hyponatremia 134 but is otherwise unremarkable.  Urinalysis unremarkable.  CT abdomen pelvis is also unremarkable.  At this time, as the patient is asymptomatic with negative workup he does  not meet inpatient criteria for admission but I did place an outpatient referral to gastroenterology to follow-up on patient's concern for possible peptic ulcer disease.  Patient then discharged with outpatient follow-up.  Return precautions given which he and his wife voiced understanding.   Additional history obtained: -Additional history obtained from wife -External records from outside source obtained and reviewed including: Chart review including previous notes, labs, imaging, consultation notes   Lab Tests: -I ordered, reviewed, and interpreted labs.   The pertinent results include:   Labs Reviewed  COMPREHENSIVE METABOLIC PANEL - Abnormal; Notable for the following components:      Result Value   Sodium 134 (*)    Glucose, Bld 156 (*)    Calcium 8.8 (*)    All other components within normal limits  URINALYSIS, ROUTINE W REFLEX MICROSCOPIC - Abnormal; Notable for the following components:  Color, Urine STRAW (*)    Hgb urine dipstick SMALL (*)    All other components within normal limits  CBC WITH DIFFERENTIAL/PLATELET  LIPASE, BLOOD      Imaging Studies ordered: I ordered imaging studies including CT abdomen pelvis I independently visualized and interpreted imaging. I agree with the radiologist interpretation   Medicines ordered and prescription drug management: Meds ordered this encounter  Medications   iohexol (OMNIPAQUE) 300 MG/ML solution 100 mL    -I have reviewed the patients home medicines and have made adjustments as needed  Critical interventions none   Cardiac Monitoring: The patient was maintained on a cardiac monitor.  I personally viewed and interpreted the cardiac monitored which showed an underlying rhythm of: NSR  Social Determinants of Health:  Factors impacting patients care include: none   Reevaluation: After the interventions noted above, I reevaluated the patient and found that they have :improved  Co morbidities that complicate the  patient evaluation  Past Medical History:  Diagnosis Date   Anxiety    Depression    Hypertension    Stroke [facility])       Dispostion: I considered admission for this patient, but at this time he does not meet inpatient criteria for admission and he is safe for discharge with outpatient follow-up     Final Clinical Impression(s) / ED Diagnoses Final diagnoses:  Generalized abdominal pain     @PCDICTATION @    Everest Brod, Wyn Forster, MD 10/17/22 2257

## 2022-10-26 ENCOUNTER — Encounter: Payer: Self-pay | Admitting: Gastroenterology

## 2022-10-26 ENCOUNTER — Ambulatory Visit (INDEPENDENT_AMBULATORY_CARE_PROVIDER_SITE_OTHER): Payer: Medicare Other | Admitting: Gastroenterology

## 2022-10-26 VITALS — BP 136/87 | HR 85 | Temp 98.8°F | Ht 69.0 in | Wt 207.0 lb

## 2022-10-26 DIAGNOSIS — R110 Nausea: Secondary | ICD-10-CM

## 2022-10-26 DIAGNOSIS — K219 Gastro-esophageal reflux disease without esophagitis: Secondary | ICD-10-CM

## 2022-10-26 DIAGNOSIS — Z1211 Encounter for screening for malignant neoplasm of colon: Secondary | ICD-10-CM

## 2022-10-26 DIAGNOSIS — R1013 Epigastric pain: Secondary | ICD-10-CM

## 2022-10-26 MED ORDER — OMEPRAZOLE 40 MG PO CPDR
40.0000 mg | DELAYED_RELEASE_CAPSULE | Freq: Two times a day (BID) | ORAL | 3 refills | Status: AC
Start: 2022-10-26 — End: ?

## 2022-10-26 NOTE — Patient Instructions (Addendum)
We will get you scheduled for a colonoscopy and upper endoscopy in the near future with Dr. Marletta Lor.  You will receive separate detailed written instructions regarding your prep.  Stop taking omeprazole 20 mg and start taking omeprazole 40 mg twice daily, 30 minutes prior to breakfast and dinner.  Follow a GERD diet:  Avoid fried, fatty, greasy, spicy, citrus foods. Avoid caffeine and carbonated beverages. Avoid chocolate. Try eating 4-6 small meals a day rather than 3 large meals. Do not eat within 3 hours of laying down. Prop head of bed up on wood or bricks to create a 6 inch incline.  We will plan to follow-up in 3 months, sooner if needed.  It was a pleasure to meet you today!  I want to create trusting relationships with patients. If you receive a survey regarding your visit,  I greatly appreciate you taking time to fill this out on paper or through your MyChart. I value your feedback.  Brooke Bonito, MSN, FNP-BC, AGACNP-BC [facility]

## 2022-10-26 NOTE — Progress Notes (Signed)
GI Office Note    Referring Provider: Glendora Score, MD Primary Care Physician:  Rebekah Chesterfield, NP  Primary Gastroenterologist: Hennie Duos. Marletta Lor, DO  Chief Complaint   Chief Complaint  Patient presents with   Abdominal Pain    Abdominal pain off and on for 2 years but worse last 2 weeks. Went to ED on 10/17/22. Has burning pain in epigastric area, nausea, bloating and gas. Takes omeprazole bid and it helps some but not a lot.    History of Present Illness   Dakota [name] is a 62 y.o. male presenting today at the request of Kommor, [place], MD for abdominal pain.  ED visit 10/17/2022 for abdominal pain and distention.  Patient reported intermittent distention and abdominal pain for 1 week and unable to localize.  Denied any nausea, vomiting, chest pain, shortness of breath.  Day of visit he was not having any symptoms.  CT as below  CT A/P with contrast 10/17/2022: -Normal gallbladder and liver without biliary dilation. -No abdominal wall hernia or abnormality -No evidence of ascites -No acute abnormality seen  No prior EGD or colonoscopy on file.   Today:  Patient reports he has been having intermittent abdominal pain for the last 2 years but worse over the last 2 weeks.  Took it long time previously and then stopped and recently started back. He complains of nausea, bloating, flatulence, and burning in the epigastric region.  Currently on omeprazole 20 mg twice daily which helps some but not significantly.  Can go months without issues.  If he eats tomatoes he will have pain for a week and with caffeine and coffee. Anything greasy bothers him. Has burning the in the lower chest. No dysphagia. Does have some occasional nausea but no vomiting. Has tried tums as well for flare. Denies any NSAIDs or aspirin powders.   Drank some goat milk and that does help his belly.   Has fear of colonoscopy due to his friends having had bowel perfs from it.   No family hx of colon  cancer or colon polyps.   No constipation or diarrhea, melena, brbpr, lack of appetite or weight loss.   Current Outpatient Medications  Medication Sig Dispense Refill   acetaminophen (TYLENOL) 325 MG tablet Take 650 mg by mouth every 6 (six) hours as needed.     aspirin EC 81 MG tablet Take 81 mg by mouth daily.     cloNIDine (CATAPRES) 0.1 MG tablet 1 tab po once daily as needed for blood pressure > 140/90 15 tablet 0   LINZESS 145 MCG CAPS capsule as needed.   98   LORazepam (ATIVAN) 1 MG tablet 1 mg every 8 (eight) hours as needed.     zolpidem (AMBIEN) 10 MG tablet 10 mg at bedtime as needed.   2   omeprazole (PRILOSEC) 20 MG capsule Take 1 capsule (20 mg total) by mouth 2 (two) times daily before a meal. 60 capsule 0   No current facility-administered medications for this visit.    Past Medical History:  Diagnosis Date   Anxiety    Depression    Hypertension    Stroke (HCC) 02/10/2016    No past surgical history on file.  Family History  Problem Relation Age of Onset   Hypertension Father    Hypertension Brother     Allergies as of 10/26/2022 - Review Complete 10/26/2022  Allergen Reaction Noted   Klonopin [clonazepam] Anaphylaxis 10/26/2022   Peanut-containing drug products  10/26/2017  Sulfa antibiotics  10/26/2017   Watermelon flavor Rash 12/04/2017    Social History   Socioeconomic History   Marital status: Single    Spouse name: Not on file   Number of children: Not on file   Years of education: Not on file   Highest education level: Not on file  Occupational History   Not on file  Tobacco Use   Smoking status: Never   Smokeless tobacco: Never  Vaping Use   Vaping status: Never Used  Substance and Sexual Activity   Alcohol use: Not Currently    Alcohol/week: 1.0 standard drink of alcohol    Types: 1 Cans of beer per week    Comment: occasional   Drug use: No   Sexual activity: Not on file  Other Topics Concern   Not on file  Social History  Narrative   Not on file   Social Determinants of Health   Financial Resource Strain: Not on file  Food Insecurity: Not on file  Transportation Needs: Not on file  Physical Activity: Not on file  Stress: Not on file  Social Connections: Not on file  Intimate Partner Violence: Not on file     Review of Systems   Gen: Denies any fever, chills, fatigue, weight loss, lack of appetite.  CV: Denies chest pain, heart palpitations, peripheral edema, syncope.  Resp: Denies shortness of breath at rest or with exertion. Denies wheezing or cough.  GI: see HPI GU : Denies urinary burning, urinary frequency, urinary hesitancy MS: Denies joint pain, muscle weakness, cramps, or limitation of movement.  Derm: Denies rash, itching, dry skin Psych: Denies depression, anxiety, memory loss, and confusion Neuro: + speech impairement Heme: Denies bruising, bleeding, and enlarged lymph nodes.   Physical Exam   Temp 98.8 F (37.1 C) (Oral)   Ht 5\' 9"  (1.753 m)   Wt 207 lb (93.9 kg)   BMI 30.57 kg/m   General:   Alert and oriented. Pleasant and cooperative. Well-nourished and well-developed.  Head:  Normocephalic and atraumatic. Eyes:  Without icterus, sclera clear and conjunctiva pink.  Ears:  Normal auditory acuity. Lungs:  Clear to auscultation bilaterally. No wheezes, rales, or rhonchi. No distress.  Heart:  S1, S2 present without murmurs appreciated.  Abdomen:  +BS, soft, non-distended. Mild ttp to epigastrium. No HSM noted. No guarding or rebound. No masses appreciated.  Rectal:  Deferred  Msk:  Symmetrical without gross deformities. Normal posture. Extremities:  Without edema. Neurologic:  Alert and  oriented x4;  grossly normal neurologically. Impaired speech.  Skin:  Intact without significant lesions or rashes. Psych:  Alert and cooperative. Normal mood and affect.  Assessment   Dakota [name] is a 62 y.o. male with a history of intracranial hemorrhage (stroke) secondary to  hypertensive emergency, anxiety, depression presenting today for evaluation of abdominal pain.  Epigastric pain, nausea, stomach burning: Has been having intermittent symptoms for the last 2 years, worsening over the last 2 weeks.  Symptoms exacerbated by dietary choices including caffeine, coffee, tomato based foods.  We discussed avoidance of these triggers and increasing his omeprazole from 20 mg to 40 mg twice daily.  Denies any NSAID or tobacco use.  Recent CT negative therefore we will evaluate symptoms with upper endoscopy.  He does not have any dysphagia therefore no need for dilation.  Differentials include esophagitis, gastritis, duodenitis, H. pylori, peptic ulcer disease.  Screening for colon cancer: Has never had a colonoscopy.  Denies any melena, BRBPR, lack appetite, unintentional weight  loss, early satiety, constipation, diarrhea, or changes in bowel habits.  Denies any family history of colon cancer or colon polyps.  Initially reported fear of having colonoscopy given to friends have had bowel perforations and passed away after colonoscopy.  PLAN   Increase omeprazole to 40 mg BID GERD diet Proceed with upper endoscopy and colonoscopy with propofol by Dr. Marletta Lor in near future: the risks, benefits, and alternatives have been discussed with the patient in detail. The patient states understanding and desires to proceed. ASA 3 Follow up in 3 months   Brooke Bonito, MSN, FNP-BC, AGACNP-BC [facility]

## 2022-10-26 NOTE — H&P (View-Only) (Signed)
 GI Office Note    Referring Provider: Glendora Score, MD Primary Care Physician:  Rebekah Chesterfield, NP  Primary Gastroenterologist: Hennie Duos. Marletta Lor, DO  Chief Complaint   Chief Complaint  Patient presents with   Abdominal Pain    Abdominal pain off and on for 2 years but worse last 2 weeks. Went to ED on 10/17/22. Has burning pain in epigastric area, nausea, bloating and gas. Takes omeprazole bid and it helps some but not a lot.    History of Present Illness   Dakota [name] is a 62 y.o. male presenting today at the request of Kommor, [place], MD for abdominal pain.  ED visit 10/17/2022 for abdominal pain and distention.  Patient reported intermittent distention and abdominal pain for 1 week and unable to localize.  Denied any nausea, vomiting, chest pain, shortness of breath.  Day of visit he was not having any symptoms.  CT as below  CT A/P with contrast 10/17/2022: -Normal gallbladder and liver without biliary dilation. -No abdominal wall hernia or abnormality -No evidence of ascites -No acute abnormality seen  No prior EGD or colonoscopy on file.   Today:  Patient reports he has been having intermittent abdominal pain for the last 2 years but worse over the last 2 weeks.  Took it long time previously and then stopped and recently started back. He complains of nausea, bloating, flatulence, and burning in the epigastric region.  Currently on omeprazole 20 mg twice daily which helps some but not significantly.  Can go months without issues.  If he eats tomatoes he will have pain for a week and with caffeine and coffee. Anything greasy bothers him. Has burning the in the lower chest. No dysphagia. Does have some occasional nausea but no vomiting. Has tried tums as well for flare. Denies any NSAIDs or aspirin powders.   Drank some goat milk and that does help his belly.   Has fear of colonoscopy due to his friends having had bowel perfs from it.   No family hx of colon  cancer or colon polyps.   No constipation or diarrhea, melena, brbpr, lack of appetite or weight loss.   Current Outpatient Medications  Medication Sig Dispense Refill   acetaminophen (TYLENOL) 325 MG tablet Take 650 mg by mouth every 6 (six) hours as needed.     aspirin EC 81 MG tablet Take 81 mg by mouth daily.     cloNIDine (CATAPRES) 0.1 MG tablet 1 tab po once daily as needed for blood pressure > 140/90 15 tablet 0   LINZESS 145 MCG CAPS capsule as needed.   98   LORazepam (ATIVAN) 1 MG tablet 1 mg every 8 (eight) hours as needed.     zolpidem (AMBIEN) 10 MG tablet 10 mg at bedtime as needed.   2   omeprazole (PRILOSEC) 20 MG capsule Take 1 capsule (20 mg total) by mouth 2 (two) times daily before a meal. 60 capsule 0   No current facility-administered medications for this visit.    Past Medical History:  Diagnosis Date   Anxiety    Depression    Hypertension    Stroke (HCC) 02/10/2016    No past surgical history on file.  Family History  Problem Relation Age of Onset   Hypertension Father    Hypertension Brother     Allergies as of 10/26/2022 - Review Complete 10/26/2022  Allergen Reaction Noted   Klonopin [clonazepam] Anaphylaxis 10/26/2022   Peanut-containing drug products  10/26/2017  Sulfa antibiotics  10/26/2017   Watermelon flavor Rash 12/04/2017    Social History   Socioeconomic History   Marital status: Single    Spouse name: Not on file   Number of children: Not on file   Years of education: Not on file   Highest education level: Not on file  Occupational History   Not on file  Tobacco Use   Smoking status: Never   Smokeless tobacco: Never  Vaping Use   Vaping status: Never Used  Substance and Sexual Activity   Alcohol use: Not Currently    Alcohol/week: 1.0 standard drink of alcohol    Types: 1 Cans of beer per week    Comment: occasional   Drug use: No   Sexual activity: Not on file  Other Topics Concern   Not on file  Social History  Narrative   Not on file   Social Determinants of Health   Financial Resource Strain: Not on file  Food Insecurity: Not on file  Transportation Needs: Not on file  Physical Activity: Not on file  Stress: Not on file  Social Connections: Not on file  Intimate Partner Violence: Not on file     Review of Systems   Gen: Denies any fever, chills, fatigue, weight loss, lack of appetite.  CV: Denies chest pain, heart palpitations, peripheral edema, syncope.  Resp: Denies shortness of breath at rest or with exertion. Denies wheezing or cough.  GI: see HPI GU : Denies urinary burning, urinary frequency, urinary hesitancy MS: Denies joint pain, muscle weakness, cramps, or limitation of movement.  Derm: Denies rash, itching, dry skin Psych: Denies depression, anxiety, memory loss, and confusion Neuro: + speech impairement Heme: Denies bruising, bleeding, and enlarged lymph nodes.   Physical Exam   Temp 98.8 F (37.1 C) (Oral)   Ht 5\' 9"  (1.753 m)   Wt 207 lb (93.9 kg)   BMI 30.57 kg/m   General:   Alert and oriented. Pleasant and cooperative. Well-nourished and well-developed.  Head:  Normocephalic and atraumatic. Eyes:  Without icterus, sclera clear and conjunctiva pink.  Ears:  Normal auditory acuity. Lungs:  Clear to auscultation bilaterally. No wheezes, rales, or rhonchi. No distress.  Heart:  S1, S2 present without murmurs appreciated.  Abdomen:  +BS, soft, non-distended. Mild ttp to epigastrium. No HSM noted. No guarding or rebound. No masses appreciated.  Rectal:  Deferred  Msk:  Symmetrical without gross deformities. Normal posture. Extremities:  Without edema. Neurologic:  Alert and  oriented x4;  grossly normal neurologically. Impaired speech.  Skin:  Intact without significant lesions or rashes. Psych:  Alert and cooperative. Normal mood and affect.  Assessment   Dakota [name] is a 62 y.o. male with a history of intracranial hemorrhage (stroke) secondary to  hypertensive emergency, anxiety, depression presenting today for evaluation of abdominal pain.  Epigastric pain, nausea, stomach burning: Has been having intermittent symptoms for the last 2 years, worsening over the last 2 weeks.  Symptoms exacerbated by dietary choices including caffeine, coffee, tomato based foods.  We discussed avoidance of these triggers and increasing his omeprazole from 20 mg to 40 mg twice daily.  Denies any NSAID or tobacco use.  Recent CT negative therefore we will evaluate symptoms with upper endoscopy.  He does not have any dysphagia therefore no need for dilation.  Differentials include esophagitis, gastritis, duodenitis, H. pylori, peptic ulcer disease.  Screening for colon cancer: Has never had a colonoscopy.  Denies any melena, BRBPR, lack appetite, unintentional weight  loss, early satiety, constipation, diarrhea, or changes in bowel habits.  Denies any family history of colon cancer or colon polyps.  Initially reported fear of having colonoscopy given to friends have had bowel perforations and passed away after colonoscopy.  PLAN   Increase omeprazole to 40 mg BID GERD diet Proceed with upper endoscopy and colonoscopy with propofol by Dr. Marletta Lor in near future: the risks, benefits, and alternatives have been discussed with the patient in detail. The patient states understanding and desires to proceed. ASA 3 Follow up in 3 months   Brooke Bonito, MSN, FNP-BC, AGACNP-BC [facility]

## 2022-10-28 ENCOUNTER — Encounter (INDEPENDENT_AMBULATORY_CARE_PROVIDER_SITE_OTHER): Payer: Self-pay

## 2022-11-17 NOTE — Patient Instructions (Addendum)
Dakota [name]  11/17/2022     @PREFPERIOPPHARMACY @   Your procedure is scheduled on  11/23/2022.   Report to [facility] at  1200 P.M.   Call this number if you have problems the morning of surgery:  [phone]  If you experience any cold or flu symptoms such as cough, fever, chills, shortness of breath, etc. between now and your scheduled surgery, please notify us at the above number.   Remember:  Follow the diet and prep instructions given to you by the office.     Take these medicines the morning of surgery with A SIP OF WATER            clonidine, lorazepam (if needed), omeprazole.     Do not wear jewelry, make-up or nail polish, including gel polish,  artificial nails, or any other type of covering on natural nails (fingers and  toes).  Do not wear lotions, powders, or perfumes, or deodorant.  Do not shave 48 hours prior to surgery.  Men may shave face and neck.  Do not bring valuables to the hospital.  [facility] is not responsible for any belongings or valuables.  Contacts, dentures or bridgework may not be worn into surgery.  Leave your suitcase in the car.  After surgery it may be brought to your room.  For patients admitted to the hospital, discharge time will be determined by your treatment team.  Patients discharged the day of surgery will not be allowed to drive home and must have someone with them for 24 hours.    Special instructions:   DO NOT smoke tobacco or vape for 24 hours before your procedure.  Please read over the following fact sheets that you were given. Anesthesia Post-op Instructions and Care and Recovery After Surgery       Upper Endoscopy, Adult, Care After After the procedure, it is common to have a sore throat. It is also common to have: Mild stomach pain or discomfort. Bloating. Nausea. Follow these instructions at home: The instructions below may help you care for yourself at home. Your health care provider may give  you more instructions. If you have questions, ask your health care provider. If you were given a sedative during the procedure, it can affect you for several hours. Do not drive or operate machinery until your health care provider says that it is safe. If you will be going home right after the procedure, plan to have a responsible adult: Take you home from the hospital or clinic. You will not be allowed to drive. Care for you for the time you are told. Follow instructions from your health care provider about what you may eat and drink. Return to your normal activities as told by your health care provider. Ask your health care provider what activities are safe for you. Take over-the-counter and prescription medicines only as told by your health care provider. Contact a health care provider if you: Have a sore throat that lasts longer than one day. Have trouble swallowing. Have a fever. Get help right away if you: Vomit blood or your vomit looks like coffee grounds. Have bloody, black, or tarry stools. Have a very bad sore throat or you cannot swallow. Have difficulty breathing or very bad pain in your chest or abdomen. These symptoms may be an emergency. Get help right away. Call 911. Do not wait to see if the symptoms will go away. Do not drive yourself to the hospital.  Summary After the procedure, it is common to have a sore throat, mild stomach discomfort, bloating, and nausea. If you were given a sedative during the procedure, it can affect you for several hours. Do not drive until your health care provider says that it is safe. Follow instructions from your health care provider about what you may eat and drink. Return to your normal activities as told by your health care provider. This information is not intended to replace advice given to you by your health care provider. Make sure you discuss any questions you have with your health care provider. Document Revised: 06/25/2021 Document  Reviewed: 06/25/2021 Elsevier Patient Education  2024 [employer]. Colonoscopy, Adult, Care After The following information offers guidance on how to care for yourself after your procedure. Your health care provider may also give you more specific instructions. If you have problems or questions, contact your health care provider. What can I expect after the procedure? After the procedure, it is common to have: A small amount of blood in your stool for 24 hours after the procedure. Some gas. Mild cramping or bloating of your abdomen. Follow these instructions at home: Eating and drinking  Drink enough fluid to keep your urine pale yellow. Follow instructions from your health care provider about eating or drinking restrictions. Resume your normal diet as told by your health care provider. Avoid heavy or fried foods that are hard to digest. Activity Rest as told by your health care provider. Avoid sitting for a long time without moving. Get up to take short walks every 1-2 hours. This is important to improve blood flow and breathing. Ask for help if you feel weak or unsteady. Return to your normal activities as told by your health care provider. Ask your health care provider what activities are safe for you. Managing cramping and bloating  Try walking around when you have cramps or feel bloated. If directed, apply heat to your abdomen as told by your health care provider. Use the heat source that your health care provider recommends, such as a moist heat pack or a heating pad. Place a towel between your skin and the heat source. Leave the heat on for 20-30 minutes. Remove the heat if your skin turns bright red. This is especially important if you are unable to feel pain, heat, or cold. You have a greater risk of getting burned. General instructions If you were given a sedative during the procedure, it can affect you for several hours. Do not drive or operate machinery until your health care  provider says that it is safe. For the first 24 hours after the procedure: Do not sign important documents. Do not drink alcohol. Do your regular daily activities at a slower pace than normal. Eat soft foods that are easy to digest. Take over-the-counter and prescription medicines only as told by your health care provider. Keep all follow-up visits. This is important. Contact a health care provider if: You have blood in your stool 2-3 days after the procedure. Get help right away if: You have more than a small spotting of blood in your stool. You have large blood clots in your stool. You have swelling of your abdomen. You have nausea or vomiting. You have a fever. You have increasing pain in your abdomen that is not relieved with medicine. These symptoms may be an emergency. Get help right away. Call 911. Do not wait to see if the symptoms will go away. Do not drive yourself to the hospital.  Summary After the procedure, it is common to have a small amount of blood in your stool. You may also have mild cramping and bloating of your abdomen. If you were given a sedative during the procedure, it can affect you for several hours. Do not drive or operate machinery until your health care provider says that it is safe. Get help right away if you have a lot of blood in your stool, nausea or vomiting, a fever, or increased pain in your abdomen. This information is not intended to replace advice given to you by your health care provider. Make sure you discuss any questions you have with your health care provider. Document Revised: 04/28/2022 Document Reviewed: 11/06/2020 Elsevier Patient Education  2024 Elsevier Inc. Monitored Anesthesia Care, Care After The following information offers guidance on how to care for yourself after your procedure. Your health care provider may also give you more specific instructions. If you have problems or questions, contact your health care provider. What can I  expect after the procedure? After the procedure, it is common to have: Tiredness. Little or no memory about what happened during or after the procedure. Impaired judgment when it comes to making decisions. Nausea or vomiting. Some trouble with balance. Follow these instructions at home: For the time period you were told by your health care provider:  Rest. Do not participate in activities where you could fall or become injured. Do not drive or use machinery. Do not drink alcohol. Do not take sleeping pills or medicines that cause drowsiness. Do not make important decisions or sign legal documents. Do not take care of children on your own. Medicines Take over-the-counter and prescription medicines only as told by your health care provider. If you were prescribed antibiotics, take them as told by your health care provider. Do not stop using the antibiotic even if you start to feel better. Eating and drinking Follow instructions from your health care provider about what you may eat and drink. Drink enough fluid to keep your urine pale yellow. If you vomit: Drink clear fluids slowly and in small amounts as you are able. Clear fluids include water, ice chips, low-calorie sports drinks, and fruit juice that has water added to it (diluted fruit juice). Eat light and bland foods in small amounts as you are able. These foods include bananas, applesauce, rice, lean meats, toast, and crackers. General instructions  Have a responsible adult stay with you for the time you are told. It is important to have someone help care for you until you are awake and alert. If you have sleep apnea, surgery and some medicines can increase your risk for breathing problems. Follow instructions from your health care provider about wearing your sleep device: When you are sleeping. This includes during daytime naps. While taking prescription pain medicines, sleeping medicines, or medicines that make you drowsy. Do  not use any products that contain nicotine or tobacco. These products include cigarettes, chewing tobacco, and vaping devices, such as e-cigarettes. If you need help quitting, ask your health care provider. Contact a health care provider if: You feel nauseous or vomit every time you eat or drink. You feel light-headed. You are still sleepy or having trouble with balance after 24 hours. You get a rash. You have a fever. You have redness or swelling around the IV site. Get help right away if: You have trouble breathing. You have new confusion after you get home. These symptoms may be an emergency. Get help right away. Call 911.  Do not wait to see if the symptoms will go away. Do not drive yourself to the hospital. This information is not intended to replace advice given to you by your health care provider. Make sure you discuss any questions you have with your health care provider. Document Revised: 08/11/2021 Document Reviewed: 08/11/2021 Elsevier Patient Education  2024 [employer].

## 2022-11-18 ENCOUNTER — Encounter (HOSPITAL_COMMUNITY): Payer: Self-pay

## 2022-11-18 ENCOUNTER — Encounter (HOSPITAL_COMMUNITY)
Admission: RE | Admit: 2022-11-18 | Discharge: 2022-11-18 | Disposition: A | Discharge status: Home / Self Care | Payer: Medicare Other | Source: Ambulatory Visit | Attending: Internal Medicine | Admitting: Internal Medicine

## 2022-11-18 VITALS — BP 158/80 | HR 70 | Temp 98.7°F | Resp 18 | Ht 69.0 in | Wt 207.0 lb

## 2022-11-18 DIAGNOSIS — I10 Essential (primary) hypertension: Secondary | ICD-10-CM

## 2022-11-18 DIAGNOSIS — Z01810 Encounter for preprocedural cardiovascular examination: Secondary | ICD-10-CM | POA: Insufficient documentation

## 2022-11-18 DIAGNOSIS — Z01818 Encounter for other preprocedural examination: Secondary | ICD-10-CM | POA: Diagnosis present

## 2022-11-18 NOTE — Progress Notes (Signed)
   11/18/22 1344  OBSTRUCTIVE SLEEP APNEA  Have you ever been diagnosed with sleep apnea through a sleep study? No  Do you snore loudly (loud enough to be heard through closed doors)?  1  Do you often feel tired, fatigued, or sleepy during the daytime (such as falling asleep during driving or talking to someone)? 0  Has anyone observed you stop breathing during your sleep? 0  Do you have, or are you being treated for high blood pressure? 1  BMI more than 35 kg/m2? 1  Age > 50 (1-yes) 1  Neck circumference greater than:Male 16 inches or larger, Male 17inches or larger? 0  Male Gender (Yes=1) 1  Obstructive Sleep Apnea Score 5  Score 5 or greater  Results sent to PCP

## 2022-11-23 ENCOUNTER — Ambulatory Visit (HOSPITAL_COMMUNITY)
Admission: RE | Admit: 2022-11-23 | Discharge: 2022-11-23 | Disposition: A | Discharge status: Home / Self Care | Payer: Medicare Other | Source: Ambulatory Visit | Attending: Internal Medicine | Admitting: Internal Medicine

## 2022-11-23 ENCOUNTER — Ambulatory Visit (HOSPITAL_COMMUNITY): Payer: Medicare Other | Admitting: Anesthesiology

## 2022-11-23 ENCOUNTER — Encounter (HOSPITAL_COMMUNITY)
Admission: RE | Disposition: A | Discharge status: Home / Self Care | Payer: Self-pay | Source: Ambulatory Visit | Attending: Internal Medicine

## 2022-11-23 ENCOUNTER — Encounter (HOSPITAL_COMMUNITY): Payer: Self-pay

## 2022-11-23 ENCOUNTER — Ambulatory Visit (HOSPITAL_BASED_OUTPATIENT_CLINIC_OR_DEPARTMENT_OTHER): Payer: Medicare Other | Admitting: Anesthesiology

## 2022-11-23 DIAGNOSIS — K222 Esophageal obstruction: Secondary | ICD-10-CM | POA: Diagnosis not present

## 2022-11-23 DIAGNOSIS — I10 Essential (primary) hypertension: Secondary | ICD-10-CM | POA: Diagnosis not present

## 2022-11-23 DIAGNOSIS — K648 Other hemorrhoids: Secondary | ICD-10-CM | POA: Diagnosis not present

## 2022-11-23 DIAGNOSIS — Z1211 Encounter for screening for malignant neoplasm of colon: Secondary | ICD-10-CM

## 2022-11-23 DIAGNOSIS — F32A Depression, unspecified: Secondary | ICD-10-CM | POA: Insufficient documentation

## 2022-11-23 DIAGNOSIS — Z79899 Other long term (current) drug therapy: Secondary | ICD-10-CM | POA: Diagnosis not present

## 2022-11-23 DIAGNOSIS — D122 Benign neoplasm of ascending colon: Secondary | ICD-10-CM

## 2022-11-23 DIAGNOSIS — E785 Hyperlipidemia, unspecified: Secondary | ICD-10-CM | POA: Diagnosis not present

## 2022-11-23 DIAGNOSIS — K2970 Gastritis, unspecified, without bleeding: Secondary | ICD-10-CM

## 2022-11-23 DIAGNOSIS — Z8673 Personal history of transient ischemic attack (TIA), and cerebral infarction without residual deficits: Secondary | ICD-10-CM | POA: Insufficient documentation

## 2022-11-23 DIAGNOSIS — D125 Benign neoplasm of sigmoid colon: Secondary | ICD-10-CM | POA: Diagnosis not present

## 2022-11-23 DIAGNOSIS — F419 Anxiety disorder, unspecified: Secondary | ICD-10-CM | POA: Diagnosis not present

## 2022-11-23 DIAGNOSIS — K635 Polyp of colon: Secondary | ICD-10-CM | POA: Diagnosis not present

## 2022-11-23 DIAGNOSIS — K2950 Unspecified chronic gastritis without bleeding: Secondary | ICD-10-CM | POA: Diagnosis not present

## 2022-11-23 HISTORY — PX: POLYPECTOMY: SHX5525

## 2022-11-23 HISTORY — PX: COLONOSCOPY WITH PROPOFOL: SHX5780

## 2022-11-23 HISTORY — PX: ESOPHAGOGASTRODUODENOSCOPY (EGD) WITH PROPOFOL: SHX5813

## 2022-11-23 HISTORY — PX: BIOPSY: SHX5522

## 2022-11-23 SURGERY — COLONOSCOPY WITH PROPOFOL
Anesthesia: General

## 2022-11-23 MED ORDER — PROPOFOL 10 MG/ML IV BOLUS
INTRAVENOUS | Status: DC | PRN
  Administered 2022-11-23: 150 mg via INTRAVENOUS
  Administered 2022-11-23: 20 mg via INTRAVENOUS
  Administered 2022-11-23: 40 mg via INTRAVENOUS

## 2022-11-23 MED ORDER — STERILE WATER FOR IRRIGATION IR SOLN
Status: DC | PRN
  Administered 2022-11-23: 50 mL

## 2022-11-23 MED ORDER — PROPOFOL 500 MG/50ML IV EMUL
INTRAVENOUS | Status: AC
  Filled 2022-11-23: qty 50

## 2022-11-23 MED ORDER — LACTATED RINGERS IV SOLN: INTRAVENOUS | Status: DC

## 2022-11-23 MED ORDER — LIDOCAINE HCL (CARDIAC) PF 100 MG/5ML IV SOSY
PREFILLED_SYRINGE | INTRAVENOUS | Status: DC | PRN
  Administered 2022-11-23: 50 mg via INTRAVENOUS

## 2022-11-23 MED ORDER — PROPOFOL 500 MG/50ML IV EMUL
INTRAVENOUS | Status: DC | PRN
  Administered 2022-11-23: 100 ug/kg/min via INTRAVENOUS

## 2022-11-23 NOTE — Discharge Instructions (Signed)
EGD Discharge instructions Please read the instructions outlined below and refer to this sheet in the next few weeks. These discharge instructions provide you with general information on caring for yourself after you leave the hospital. Your doctor may also give you specific instructions. While your treatment has been planned according to the most current medical practices available, unavoidable complications occasionally occur. If you have any problems or questions after discharge, please call your doctor. ACTIVITY You may resume your regular activity but move at a slower pace for the next 24 hours.  Take frequent rest periods for the next 24 hours.  Walking will help expel (get rid of) the air and reduce the bloated feeling in your abdomen.  No driving for 24 hours (because of the anesthesia (medicine) used during the test).  You may shower.  Do not sign any important legal documents or operate any machinery for 24 hours (because of the anesthesia used during the test).  NUTRITION Drink plenty of fluids.  You may resume your normal diet.  Begin with a light meal and progress to your normal diet.  Avoid alcoholic beverages for 24 hours or as instructed by your caregiver.  MEDICATIONS You may resume your normal medications unless your caregiver tells you otherwise.  WHAT YOU CAN EXPECT TODAY You may experience abdominal discomfort such as a feeling of fullness or "gas" pains.  FOLLOW-UP Your doctor will discuss the results of your test with you.  SEEK IMMEDIATE MEDICAL ATTENTION IF ANY OF THE FOLLOWING OCCUR: Excessive nausea (feeling sick to your stomach) and/or vomiting.  Severe abdominal pain and distention (swelling).  Trouble swallowing.  Temperature over 101 F (37.8 C).  Rectal bleeding or vomiting of blood.    Colonoscopy Discharge Instructions  Read the instructions outlined below and refer to this sheet in the next few weeks. These discharge instructions provide you with  general information on caring for yourself after you leave the hospital. Your doctor may also give you specific instructions. While your treatment has been planned according to the most current medical practices available, unavoidable complications occasionally occur.   ACTIVITY You may resume your regular activity, but move at a slower pace for the next 24 hours.  Take frequent rest periods for the next 24 hours.  Walking will help get rid of the air and reduce the bloated feeling in your belly (abdomen).  No driving for 24 hours (because of the medicine (anesthesia) used during the test).   Do not sign any important legal documents or operate any machinery for 24 hours (because of the anesthesia used during the test).  NUTRITION Drink plenty of fluids.  You may resume your normal diet as instructed by your doctor.  Begin with a light meal and progress to your normal diet. Heavy or fried foods are harder to digest and may make you feel sick to your stomach (nauseated).  Avoid alcoholic beverages for 24 hours or as instructed.  MEDICATIONS You may resume your normal medications unless your doctor tells you otherwise.  WHAT YOU CAN EXPECT TODAY Some feelings of bloating in the abdomen.  Passage of more gas than usual.  Spotting of blood in your stool or on the toilet paper.  IF YOU HAD POLYPS REMOVED DURING THE COLONOSCOPY: No aspirin products for 7 days or as instructed.  No alcohol for 7 days or as instructed.  Eat a soft diet for the next 24 hours.  FINDING OUT THE RESULTS OF YOUR TEST Not all test results are available  during your visit. If your test results are not back during the visit, make an appointment with your caregiver to find out the results. Do not assume everything is normal if you have not heard from your caregiver or the medical facility. It is important for you to follow up on all of your test results.  SEEK IMMEDIATE MEDICAL ATTENTION IF: You have more than a spotting of  blood in your stool.  Your belly is swollen (abdominal distention).  You are nauseated or vomiting.  You have a temperature over 101.  You have abdominal pain or discomfort that is severe or gets worse throughout the day.   Your EGD revealed mild amount inflammation in your stomach.  I took biopsies of this to rule out infection with a bacteria called H. pylori.  Await pathology results, my office will contact you.  Esophagus and small bowel otherwise appeared normal.   Continue on Omeprazole twice daily  Your colonoscopy revealed 2 polyp(s) which I removed successfully. Await pathology results, my office will contact you. I recommend repeating colonoscopy in 3 years for surveillance purposes and borderline colon prep today.   Follow up in GI office as previously scheduled.   I hope you have a great rest of your week!  Hennie Duos. Marletta Lor, D.O. Gastroenterology and Hepatology [facility]

## 2022-11-23 NOTE — Interval H&P Note (Signed)
History and Physical Interval Note:  11/23/2022 2:49 PM  Dakota [name]  has presented today for surgery, with the diagnosis of SCREENING,GERD.  The various methods of treatment have been discussed with the patient and family. After consideration of risks, benefits and other options for treatment, the patient has consented to  Procedure(s) with comments: COLONOSCOPY WITH PROPOFOL (N/A) - 2:00PM;ASA 3 ESOPHAGOGASTRODUODENOSCOPY (EGD) WITH PROPOFOL (N/A) - 2:00PM;ASA 3 as a surgical intervention.  The patient's history has been reviewed, patient examined, no change in status, stable for surgery.  I have reviewed the patient's chart and labs.  Questions were answered to the patient's satisfaction.     Lanelle Bal

## 2022-11-23 NOTE — Transfer of Care (Signed)
Immediate Anesthesia Transfer of Care Note  Patient: Dakota [name]  Procedure(s) Performed: COLONOSCOPY WITH PROPOFOL ESOPHAGOGASTRODUODENOSCOPY (EGD) WITH PROPOFOL BIOPSY POLYPECTOMY  Patient Location: Short Stay  Anesthesia Type:General  Level of Consciousness: awake and patient cooperative  Airway & Oxygen Therapy: Patient Spontanous Breathing  Post-op Assessment: Report given to RN and Post -op Vital signs reviewed and stable  Post vital signs: Reviewed and stable  Last Vitals:  Vitals Value Taken Time  BP 127/77 11/23/22 1528  Temp 36.6 C 11/23/22 1528  Pulse 69 11/23/22 1528  Resp 18 11/23/22 1528  SpO2 99 % 11/23/22 1528    Last Pain:  Vitals:   11/23/22 1528  TempSrc: Oral  PainSc: 0-No pain      Patients Stated Pain Goal: 5 (11/23/22 1313)  Complications: No notable events documented.

## 2022-11-23 NOTE — Op Note (Signed)
[facility] Patient Name: Dakota [name] Procedure Date: 11/23/2022 2:49 PM MRN: [id number] Date of Birth: 16-Sep-1960 Attending MD: Hennie Duos. Marletta Lor , [place], [id number] CSN: [id number] Age: 62 Admit Type: Outpatient Procedure:                Upper GI endoscopy Indications:              Epigastric abdominal pain, Heartburn Providers:                Hennie Duos. Marletta Lor, DO, Buel Ream. Thomasena Edis RN, RN,                            Dyann Ruddle, Elinor Parkinson Referring MD:              Medicines:                See the Anesthesia note for documentation of the                            administered medications Complications:            No immediate complications. Estimated Blood Loss:     Estimated blood loss was minimal. Procedure:                Pre-Anesthesia Assessment:                           - The anesthesia plan was to use monitored                            anesthesia care (MAC).                           After obtaining informed consent, the endoscope was                            passed under direct vision. Throughout the                            procedure, the patient's blood pressure, pulse, and                            oxygen saturations were monitored continuously. The                            [id number]-H190 ([id number]) scope was introduced through the                            mouth, and advanced to the second part of duodenum.                            The upper GI endoscopy was accomplished without                            difficulty. The patient tolerated the procedure  well. Scope In: 2:57:59 PM Scope Out: 3:02:42 PM Total Procedure Duration: 0 hours 4 minutes 43 seconds  Findings:      The Z-line was regular and was found 40 cm from the incisors.      A non-obstructing Schatzki ring was found in the distal esophagus.      Patchy mild inflammation characterized by congestion (edema) and       erythema was found in the entire  examined stomach. Biopsies were taken       with a cold forceps for Helicobacter pylori testing.      The duodenal bulb, first portion of the duodenum and second portion of       the duodenum were normal. Impression:               - Z-line regular, 40 cm from the incisors.                           - Non-obstructing Schatzki ring.                           - Gastritis. Biopsied.                           - Normal duodenal bulb, first portion of the                            duodenum and second portion of the duodenum. Moderate Sedation:      Per Anesthesia Care Recommendation:           - Patient has a contact number available for                            emergencies. The signs and symptoms of potential                            delayed complications were discussed with the                            patient. Return to normal activities tomorrow.                            Written discharge instructions were provided to the                            patient.                           - Resume previous diet.                           - Continue present medications.                           - Await pathology results.                           - Return to GI clinic in 3 months.                           -  Use a proton pump inhibitor PO BID. Procedure Code(s):        --- Professional ---                           [phone], Esophagogastroduodenoscopy, flexible,                            transoral; with biopsy, single or multiple Diagnosis Code(s):        --- Professional ---                           K22.2, Esophageal obstruction                           K29.70, Gastritis, unspecified, without bleeding                           R10.13, Epigastric pain                           R12, Heartburn CPT copyright 2022 American Medical Association. All rights reserved. The codes documented in this report are preliminary and upon coder review may  be revised to meet current compliance  requirements. Hennie Duos. Marletta Lor, DO Hennie Duos. Marletta Lor, DO 11/23/2022 3:05:20 PM This report has been signed electronically. Number of Addenda: 0

## 2022-11-23 NOTE — Anesthesia Procedure Notes (Signed)
Date/Time: 11/23/2022 2:51 PM  Performed by: Franco Nones, CRNAPre-anesthesia Checklist: Patient identified, Emergency Drugs available, Suction available, Timeout performed and Patient being monitored Patient Re-evaluated:Patient Re-evaluated prior to induction Oxygen Delivery Method: Nasal Cannula

## 2022-11-23 NOTE — Op Note (Signed)
[facility] Patient Name: Dakota [name] Procedure Date: 11/23/2022 2:48 PM MRN: [id number] Date of Birth: 06-Mar-1961 Attending MD: Hennie Duos. Marletta Lor , [place], [id number] CSN: [id number] Age: 62 Admit Type: Outpatient Procedure:                Colonoscopy Indications:              Screening for colorectal malignant neoplasm Providers:                Hennie Duos. Marletta Lor, DO, Buel Ream. Thomasena Edis RN, RN,                            Dyann Ruddle, Elinor Parkinson Referring MD:              Medicines:                See the Anesthesia note for documentation of the                            administered medications Complications:            No immediate complications. Estimated Blood Loss:     Estimated blood loss was minimal. Procedure:                Pre-Anesthesia Assessment:                           - The anesthesia plan was to use monitored                            anesthesia care (MAC).                           After obtaining informed consent, the colonoscope                            was passed under direct vision. Throughout the                            procedure, the patient's blood pressure, pulse, and                            oxygen saturations were monitored continuously. The                            [id number]-HQ190L ([id number]) was introduced through the                            anus and advanced to the the cecum, identified by                            appendiceal orifice and ileocecal valve. The                            colonoscopy was performed without difficulty. The                            patient tolerated the  procedure well. The quality                            of the bowel preparation was evaluated using the                            BBPS [facility] Bowel Preparation Scale) with scores                            of: Right Colon = 2 (minor amount of residual                            staining, small fragments of stool and/or opaque                             liquid, but mucosa seen well), Transverse Colon = 2                            (minor amount of residual staining, small fragments                            of stool and/or opaque liquid, but mucosa seen                            well) and Left Colon = 2 (minor amount of residual                            staining, small fragments of stool and/or opaque                            liquid, but mucosa seen well). The total BBPS score                            equals 6. Fair. Scope In: 3:06:18 PM Scope Out: 3:22:16 PM Scope Withdrawal Time: 0 hours 12 minutes 27 seconds  Total Procedure Duration: 0 hours 15 minutes 58 seconds  Findings:      Non-bleeding internal hemorrhoids were found.      A 6 mm polyp was found in the ascending colon. The polyp was sessile.       The polyp was removed with a cold snare. Resection and retrieval were       complete.      An 8 mm polyp was found in the sigmoid colon. The polyp was sessile. The       polyp was removed with a cold snare. Resection and retrieval were       complete.      A moderate amount of semi-liquid semi-solid stool was found in the       entire colon, making visualization difficult. Lavage of the area was       performed using copious amounts of sterile water, resulting in clearance       with fair visualization. Impression:               - Non-bleeding internal hemorrhoids.                           -  One 6 mm polyp in the ascending colon, removed                            with a cold snare. Resected and retrieved.                           - One 8 mm polyp in the sigmoid colon, removed with                            a cold snare. Resected and retrieved.                           - Stool in the entire examined colon. Moderate Sedation:      Per Anesthesia Care Recommendation:           - Patient has a contact number available for                            emergencies. The signs and symptoms of potential                             delayed complications were discussed with the                            patient. Return to normal activities tomorrow.                            Written discharge instructions were provided to the                            patient.                           - Resume previous diet.                           - Continue present medications.                           - Await pathology results.                           - Repeat colonoscopy in 3 years for surveillance                            and borderline colon prep today.                           - Return to GI clinic in 3 months. Procedure Code(s):        --- Professional ---                           [phone], Colonoscopy, flexible; with removal of                            tumor(s),  polyp(s), or other lesion(s) by snare                            technique Diagnosis Code(s):        --- Professional ---                           Z12.11, Encounter for screening for malignant                            neoplasm of colon                           D12.2, Benign neoplasm of ascending colon                           D12.5, Benign neoplasm of sigmoid colon                           K64.8, Other hemorrhoids CPT copyright 2022 American Medical Association. All rights reserved. The codes documented in this report are preliminary and upon coder review may  be revised to meet current compliance requirements. Hennie Duos. Marletta Lor, DO Hennie Duos. Marletta Lor, DO 11/23/2022 3:26:26 PM This report has been signed electronically. Number of Addenda: 0

## 2022-11-23 NOTE — Anesthesia Preprocedure Evaluation (Signed)
Anesthesia Evaluation  Patient identified by MRN, date of birth, ID band Patient awake    Reviewed: Allergy & Precautions, H&P , NPO status , Patient's Chart, lab work & pertinent test results, reviewed documented beta blocker date and time   Airway Mallampati: II  TM Distance: >3 FB Neck ROM: full    Dental no notable dental hx.    Pulmonary neg pulmonary ROS   Pulmonary exam normal breath sounds clear to auscultation       Cardiovascular Exercise Tolerance: Good hypertension, negative cardio ROS  Rhythm:regular Rate:Normal     Neuro/Psych  PSYCHIATRIC DISORDERS Anxiety Depression    CVA negative neurological ROS  negative psych ROS   GI/Hepatic negative GI ROS, Neg liver ROS,,,  Endo/Other  negative endocrine ROS    Renal/GU negative Renal ROS  negative genitourinary   Musculoskeletal   Abdominal   Peds  Hematology negative hematology ROS (+)   Anesthesia Other Findings   Reproductive/Obstetrics negative OB ROS                             Anesthesia Physical Anesthesia Plan  ASA: 3  Anesthesia Plan: General   Post-op Pain Management:    Induction:   PONV Risk Score and Plan: Propofol infusion  Airway Management Planned:   Additional Equipment:   Intra-op Plan:   Post-operative Plan:   Informed Consent: I have reviewed the patients History and Physical, chart, labs and discussed the procedure including the risks, benefits and alternatives for the proposed anesthesia with the patient or authorized representative who has indicated his/her understanding and acceptance.     Dental Advisory Given  Plan Discussed with: CRNA  Anesthesia Plan Comments:        Anesthesia Quick Evaluation

## 2022-11-25 ENCOUNTER — Telehealth: Payer: Self-pay | Admitting: *Deleted

## 2022-11-25 ENCOUNTER — Other Ambulatory Visit: Payer: Self-pay | Admitting: Internal Medicine

## 2022-11-25 LAB — SURGICAL PATHOLOGY

## 2022-11-25 MED ORDER — SUCRALFATE 1 G PO TABS: 1.0000 g | ORAL_TABLET | Freq: Three times a day (TID) | ORAL | 1 refills | Status: AC

## 2022-11-25 NOTE — Telephone Encounter (Signed)
-----   Message from Nurse Vinetta Bergamo sent at 11/25/2022  8:19 AM EDT ----- Regarding: Patient  having abdominal pain and calling office and unable to get intouch with anyone, also asking about biopsy results . procedure done on Patient wife " [name] " called and stating she is trying to get in touch with Dr. Marletta Lor  about husband having abdominal pain and asked about the biopsy results . Stated she left a message when called the office but has  not got any response.

## 2022-11-25 NOTE — Telephone Encounter (Signed)
Looks like pathology still pending. Not sure which number she has been calling and leaving a message on.   Called pt and he is aware. He is still having abd pain. This was going on prior to procedure. Upper abd. Nothing has changed. Wants to know if they have to wait for results before anything can be done? Wants to know if patient can have abx called in for relief?

## 2022-11-25 NOTE — Telephone Encounter (Signed)
Patient's gastric biopsies showed chronic gastritis negative for H. pylori.  He does not need abx. Polyps removed from his colon tubular adenomas repeat colonoscopy 3 years.  Continue on omeprazole twice daily.  I will send in Carafate to take up to 4 times a day day as needed for breakthrough symptoms/abdominal pain.  Follow-up as previously scheduled

## 2022-11-25 NOTE — Telephone Encounter (Signed)
Spoke with spouse and she is aware of results/recs. She voiced understanding. TCS nic'd for 3 years

## 2022-11-25 NOTE — Telephone Encounter (Signed)
From: Laurey Morale, RN  Sent: 11/25/2022   8:25 AM EDT  To: Simone Curia; Lanelle Bal, DO  Subject: Patient  having abdominal pain and calling o*   Patient wife " Dakota [name] " called and stating she is trying to get in touch with Dr. Marletta Lor  about husband having abdominal pain and asked about the biopsy results . Stated she left a message when called the office but has  not got any response.

## 2022-11-26 ENCOUNTER — Encounter (HOSPITAL_COMMUNITY): Payer: Self-pay | Admitting: Internal Medicine

## 2022-11-27 NOTE — Anesthesia Postprocedure Evaluation (Signed)
Anesthesia Post Note  Patient: Dakota [name]  Procedure(s) Performed: COLONOSCOPY WITH PROPOFOL ESOPHAGOGASTRODUODENOSCOPY (EGD) WITH PROPOFOL BIOPSY POLYPECTOMY  Patient location during evaluation: Phase II Anesthesia Type: General Level of consciousness: awake Pain management: pain level controlled Vital Signs Assessment: post-procedure vital signs reviewed and stable Respiratory status: spontaneous breathing and respiratory function stable Cardiovascular status: blood pressure returned to baseline and stable Postop Assessment: no headache and no apparent nausea or vomiting Anesthetic complications: no Comments: Late entry   No notable events documented.   Last Vitals:  Vitals:   11/23/22 1313 11/23/22 1528  BP: (!) 155/77 127/77  Pulse: 85 69  Resp: 18 18  Temp: 36.6 C 36.6 C  SpO2: 100% 99%    Last Pain:  Vitals:   11/23/22 1528  TempSrc: Oral  PainSc: 0-No pain                 Windell Norfolk

## 2023-01-26 ENCOUNTER — Ambulatory Visit: Payer: Medicare Other | Admitting: Gastroenterology

## 2023-11-10 ENCOUNTER — Encounter (HOSPITAL_COMMUNITY): Payer: Self-pay

## 2023-11-10 ENCOUNTER — Other Ambulatory Visit: Payer: Self-pay

## 2023-11-10 ENCOUNTER — Emergency Department (HOSPITAL_COMMUNITY)
Admission: EM | Admit: 2023-11-10 | Discharge: 2023-11-10 | Disposition: A | Discharge status: Home / Self Care | Attending: Emergency Medicine | Admitting: Emergency Medicine

## 2023-11-10 ENCOUNTER — Emergency Department (HOSPITAL_COMMUNITY)

## 2023-11-10 DIAGNOSIS — S93402A Sprain of unspecified ligament of left ankle, initial encounter: Secondary | ICD-10-CM | POA: Diagnosis not present

## 2023-11-10 DIAGNOSIS — Z91010 Allergy to peanuts: Secondary | ICD-10-CM | POA: Insufficient documentation

## 2023-11-10 DIAGNOSIS — Z7982 Long term (current) use of aspirin: Secondary | ICD-10-CM | POA: Diagnosis not present

## 2023-11-10 DIAGNOSIS — S99912A Unspecified injury of left ankle, initial encounter: Secondary | ICD-10-CM | POA: Diagnosis present

## 2023-11-10 DIAGNOSIS — X501XXA Overexertion from prolonged static or awkward postures, initial encounter: Secondary | ICD-10-CM | POA: Insufficient documentation

## 2023-11-10 MED ORDER — NAPROXEN 250 MG PO TABS
500.0000 mg | ORAL_TABLET | Freq: Once | ORAL | Status: DC
  Filled 2023-11-10: qty 2

## 2023-11-10 NOTE — ED Provider Notes (Signed)
 [name]   CSN: [id number] Arrival date & time: 11/10/23  1430     Patient presents with: Dakota [name]   Dakota [name] is a 63 y.o. male.    Fall   This patient is a 63 year old male, he has a history of acid reflux, he is on clonidine , Linzess and Ativan as needed.  He reports that he was walking outside of [employer] after getting him a biscuit and slipped on some oil patch on the concrete causing him to land on his back.  He injured his left ankle when it twisted and injured his right shoulder although he has no pain with range of motion, he does have pain shooting from his neck into the right shoulder.  There is no numbness or weakness in the arm, denies any headache loss of consciousness changes in vision nausea vomiting chest pain shortness of breath or abdominal pain.  He has some difficulty ambulating because of the left lateral ankle pain and swelling.    Prior to Admission medications   Medication Sig Start Date End Date Taking? Authorizing Provider  acetaminophen  (TYLENOL ) 325 MG tablet Take 650 mg by mouth every 6 (six) hours as needed.    [provider]  aspirin EC 81 MG tablet Take 81 mg by mouth daily.    [provider]  cloNIDine  (CATAPRES ) 0.1 MG tablet 1 tab po once daily as needed for blood pressure > 140/90 05/27/15   Tonia Chew, MD  LINZESS 145 MCG CAPS capsule as needed.  07/03/15   [provider]  LORazepam (ATIVAN) 1 MG tablet 1 mg every 8 (eight) hours as needed. 08/12/21   [provider]  omeprazole  (PRILOSEC) 40 MG capsule Take 1 capsule (40 mg total) by mouth 2 (two) times daily. 10/26/22   Kennedy Charmaine CROME, NP  sucralfate  (CARAFATE ) 1 g tablet Take 1 tablet (1 g total) by mouth 4 (four) times daily -  with meals and at bedtime. 11/25/22 01/24/23  Carver, Charles K, DO  zolpidem (AMBIEN) 10 MG tablet 10 mg at bedtime as needed.  10/17/15   [provider]     Allergies: Klonopin [clonazepam], Peanut-containing drug products, Sulfa antibiotics, and Watermelon flavoring agent (non-screening)    Review of Systems  All other systems reviewed and are negative.   Updated Vital Signs BP (!) 158/101 (BP Location: Left Arm)   Pulse 90   Temp 97.8 F (36.6 C)   Resp 16   Ht 1.753 m (5' 9)   Wt 97.5 kg   SpO2 95%   BMI 31.75 kg/m   Physical Exam Vitals and nursing note reviewed.  Constitutional:      General: He is not in acute distress.    Appearance: He is well-developed.  HENT:     Head: Normocephalic and atraumatic.     Mouth/Throat:     Pharynx: No oropharyngeal exudate.  Eyes:     General: No scleral icterus.       Right eye: No discharge.        Left eye: No discharge.     Conjunctiva/sclera: Conjunctivae normal.     Pupils: Pupils are equal, round, and reactive to light.  Neck:     Thyroid: No thyromegaly.     Vascular: No JVD.  Cardiovascular:     Rate and Rhythm: Normal rate and regular rhythm.     Heart sounds: Normal heart sounds. No murmur heard.    No friction  rub. No gallop.  Pulmonary:     Effort: Pulmonary effort is normal. No respiratory distress.     Breath sounds: Normal breath sounds. No wheezing or rales.  Abdominal:     General: Bowel sounds are normal. There is no distension.     Palpations: Abdomen is soft. There is no mass.     Tenderness: There is no abdominal tenderness.  Musculoskeletal:        General: Tenderness present. Normal range of motion.     Cervical back: Normal range of motion and neck supple.     Comments: Tenderness with palpation over the left lateral ankle, no tenderness over the lumbar thoracic or cervical spines, full range of motion of the neck without any pain.  No signs of trauma to the head.  Full range of motion of the left ankle despite lateral ankle tenderness.  Knee with full range of motion hip with full range of motion right arm and right leg with full range of motion  including the right shoulder.  Normal active and passive range of motion of the right shoulder.  Normal pulses at the bilateral wrist, normal sensation diffusely.  Lymphadenopathy:     Cervical: No cervical adenopathy.  Skin:    General: Skin is warm and dry.     Findings: No erythema or rash.  Neurological:     Mental Status: He is alert.     Coordination: Coordination normal.  Psychiatric:        Behavior: Behavior normal.     (all labs ordered are listed, but only abnormal results are displayed) Labs Reviewed - No data to display  EKG: None  Radiology: DG Cervical Spine 2-3 Views Result Date: 11/10/2023 CLINICAL DATA:  injury.  Fall. EXAM: CERVICAL SPINE - 2-3 VIEW COMPARISON:  None Available. FINDINGS: There is loss of cervical lordosis, which may be on the basis of positioning or due to muscle spasm, No spondylolisthesis. Vertebral body heights are maintained. No fracture or destructive lesion. The C1 lateral masses are symmetrically positioned around the odontoid process. Mild multilevel degenerative changes in the form of facet arthropathy and marginal osteophyte formation. Prevertebral soft tissues within normal limits. IMPRESSION: No acute osseous abnormality of the cervical spine. Electronically Signed   By: Ree Molt M.D.   On: 11/10/2023 16:16   DG Shoulder Right Result Date: 11/10/2023 CLINICAL DATA:  injury.  Fall. EXAM: RIGHT SHOULDER - 2+ VIEW COMPARISON:  None Available. FINDINGS: No acute fracture or dislocation. No aggressive osseous lesion. Glenohumeral and acromioclavicular joints are normal in alignment and exhibit mild degenerative changes. No soft tissue swelling. No radiopaque foreign bodies. IMPRESSION: No acute osseous abnormality of the right shoulder. Electronically Signed   By: Ree Molt M.D.   On: 11/10/2023 16:15   DG Ankle Complete Left Result Date: 11/10/2023 CLINICAL DATA:  injury.  Slip and fall. EXAM: LEFT ANKLE COMPLETE - 3+ VIEW COMPARISON:   None Available. FINDINGS: No acute fracture or dislocation. No aggressive osseous lesion. Ankle mortise appears intact. No focal soft tissue swelling. No radiopaque foreign bodies. IMPRESSION: No acute osseous abnormality of the left ankle joint. Electronically Signed   By: Ree Molt M.D.   On: 11/10/2023 16:14     Procedures   Medications Ordered in the ED - No data to display  Medical Decision Making Amount and/or Complexity of Data Reviewed Radiology: ordered.    This patient presents to the ED for concern of trauma injury from fall, left ankle pain differential diagnosis includes fracture dislocation of the left ankle.  Will obtain imaging, no other significant signs of trauma, imaging ordered by nursing prior to my evaluation including a right shoulder cervical spine and thoracic spine.    Additional history obtained   Additional history obtained from Electronic Medical Record External records from outside source obtained and reviewed including medical record, the patient had been admitted for colonoscopy a year ago, no other recent admissions to the hospital he did have a history of a atraumatic brain injury with subcortical hemorrhage in the past.  Again the patient denies seizures, headache, and he clearly recalls slipping on oil which caused the fall     Imaging Studies ordered:  I ordered imaging studies including plain films of the left ankle right shoulder cervical spine and thoracic spine I independently visualized and interpreted imaging which showed no acute fractures I agree with the radiologist interpretation   Medicines ordered and prescription drug management:  I ordered medication including Naprosyn     I have reviewed the patients home medicines and have made adjustments as needed   Problem List / ED Course:  Improved   Social Determinants of Health:  I have discussed with the patient at the bedside the  results, and the meaning of these results.  They have had opportunity to ask questions,  expressed their understanding to the need for follow-up with primary care physician        Final diagnoses:  Sprain of left ankle, unspecified ligament, initial encounter    ED Discharge Orders     None          Cleotilde Rogue, MD 11/10/23 1708

## 2023-11-10 NOTE — ED Triage Notes (Signed)
 Pt reports he fell this morning around 1030 leaving [employer] in the rain.  Pt reports left ankle, right shoulder, neck and back pain ever since.

## 2023-11-10 NOTE — Discharge Instructions (Addendum)
 Thankfully all of your testing was normal, you can take ibuprofen or Aleve  as needed for pain, keep an Ace wrap on your ankle, see your family doctor if no better within 1 week but this should gradually get better.  Thankfully no other broken bones of your neck your shoulder or your ankle or your back.
